# Patient Record
Sex: Female | Born: 1985 | Race: White | Hispanic: No | Marital: Single | Smoking: Never smoker
Health system: Southern US, Community
[De-identification: ages and names within clinical notes are randomized; demographics above are authoritative.]

## PROBLEM LIST (undated history)

## (undated) DIAGNOSIS — R519 Headache, unspecified: Secondary | ICD-10-CM

## (undated) DIAGNOSIS — E0590 Thyrotoxicosis, unspecified without thyrotoxic crisis or storm: Secondary | ICD-10-CM

## (undated) DIAGNOSIS — R0600 Dyspnea, unspecified: Secondary | ICD-10-CM

## (undated) DIAGNOSIS — O24419 Gestational diabetes mellitus in pregnancy, unspecified control: Secondary | ICD-10-CM

## (undated) DIAGNOSIS — K8050 Calculus of bile duct without cholangitis or cholecystitis without obstruction: Secondary | ICD-10-CM

## (undated) DIAGNOSIS — Z87442 Personal history of urinary calculi: Secondary | ICD-10-CM

## (undated) DIAGNOSIS — Z8489 Family history of other specified conditions: Secondary | ICD-10-CM

## (undated) DIAGNOSIS — F419 Anxiety disorder, unspecified: Secondary | ICD-10-CM

## (undated) DIAGNOSIS — F32A Depression, unspecified: Secondary | ICD-10-CM

## (undated) DIAGNOSIS — D649 Anemia, unspecified: Secondary | ICD-10-CM

## (undated) DIAGNOSIS — J45909 Unspecified asthma, uncomplicated: Secondary | ICD-10-CM

## (undated) DIAGNOSIS — J189 Pneumonia, unspecified organism: Secondary | ICD-10-CM

## (undated) HISTORY — DX: Unspecified asthma, uncomplicated: J45.909

## (undated) HISTORY — DX: Depression, unspecified: F32.A

## (undated) HISTORY — DX: Gestational diabetes mellitus in pregnancy, unspecified control: O24.419

## (undated) HISTORY — PX: TONSILLECTOMY: SUR1361

## (undated) HISTORY — DX: Anxiety disorder, unspecified: F41.9

---

## 2013-12-09 DIAGNOSIS — N3946 Mixed incontinence: Secondary | ICD-10-CM | POA: Insufficient documentation

## 2021-03-04 NOTE — L&D Delivery Note (Cosign Needed Addendum)
LABOR COURSE  Rachel Mendez is a 36 y.o. female G2P1001 with IUP at [redacted]w[redacted]d presenting for IOL for polyhydramnios, hyperthryoidism on PTU, A1GDM. TOLAC. Prolonged labor course utilizing foley balloon, AROM and pitocin. Internals placed due to maternal body habitus.   Delivery Note Called to room and patient was complete and pushing. Head delivered OA. Nuchal cord present x1. Unable to reduce nuchal while delivering so baby somersaulted after delivery and onto maternal abdomen. Shoulder and body delivered in usual fashion. At  1836 a viable and healthy female was delivered via Vaginal, Spontaneous (Presentation:OA;LOA ).  Infant with spontaneous cry, placed on mother's abdomen, dried and stimulated. Cord clamped x 2 after 1-minute delay, and cut by FOB. Cord blood drawn. Placenta delivered spontaneously with gentle cord traction. Appears intact. Fundus firm with massage and Pitocin. Labia, perineum, vagina, and cervix inspected with 2nd degree perineal laceration.    APGAR: 8,9 ; weight not yet recorded.  Cord: 3VC with the following complications:N/A.    Anesthesia: Epidural Episiotomy: None Lacerations: 2nd degree perineal laceration Suture Repair: 3.0 vicryl Est. Blood Loss (mL): 87  Mom to postpartum.  Baby to Couplet care / Skin to Skin.  Cashion, Cyndra Numbers, MD Resident Physician 7:19 PM     Fellow ATTESTATION  I was present and gloved for this delivery and agree with the above documentation in the resident's note. Successful VBAC. TXA after delivery of placenta, given risk of PPH due to length of time on pitocin. Baby seen by pediatrics team due to needing O2 in transitional period.  Alfredia Ferguson, MD/[facility] [employer] (Faculty Practice) 11/13/2021, 7:52 PM

## 2021-04-11 ENCOUNTER — Ambulatory Visit (INDEPENDENT_AMBULATORY_CARE_PROVIDER_SITE_OTHER): Payer: BC Managed Care – PPO

## 2021-04-11 ENCOUNTER — Other Ambulatory Visit: Payer: Self-pay

## 2021-04-11 ENCOUNTER — Ambulatory Visit (INDEPENDENT_AMBULATORY_CARE_PROVIDER_SITE_OTHER): Payer: BC Managed Care – PPO | Admitting: *Deleted

## 2021-04-11 VITALS — BP 140/81 | HR 81 | Ht 65.0 in | Wt 278.0 lb

## 2021-04-11 DIAGNOSIS — Z98891 History of uterine scar from previous surgery: Secondary | ICD-10-CM

## 2021-04-11 DIAGNOSIS — O3680X0 Pregnancy with inconclusive fetal viability, not applicable or unspecified: Secondary | ICD-10-CM

## 2021-04-11 DIAGNOSIS — Z3481 Encounter for supervision of other normal pregnancy, first trimester: Secondary | ICD-10-CM

## 2021-04-11 DIAGNOSIS — Z3A01 Less than 8 weeks gestation of pregnancy: Secondary | ICD-10-CM | POA: Diagnosis not present

## 2021-04-11 DIAGNOSIS — O0990 Supervision of high risk pregnancy, unspecified, unspecified trimester: Secondary | ICD-10-CM

## 2021-04-11 NOTE — Progress Notes (Addendum)
Last depo was in October  Last pap was with [facility] 2021  New OB Intake  I explained I am completing New OB Intake today. We discussed her EDD of 11/30/2021 that is based off today's scan. Pt was on depo and her last injection was in October.I reviewed her allergies, medications, Medical/Surgical/OB history, and appropriate screenings. I informed her of [facility] services.  There are no problems to display for this patient.   Concerns addressed today  Delivery Plans:  Plans to deliver at [facility].    Anatomy US Explained first scheduled [place] will be around 19 weeks.   Labs Discussed Avelina Laine genetic screening with patient. Would like both Panorama and Horizon drawn at new OB visit. Routine prenatal labs needed.   Placed OB Box on problem list and updated   Patient informed that the ultrasound is considered a limited obstetric ultrasound and is not intended to be a complete ultrasound exam.  Patient also informed that the ultrasound is not being completed with the intent of assessing for fetal or placental anomalies or any pelvic abnormalities. Explained that the purpose of today's ultrasound is to assess for dating and fetal heart rate.  Patient acknowledges the purpose of the exam and the limitations of the study.      First visit review I reviewed new OB appt with pt. I explained she will have  ob bloodwork with genetic screening.  Explained pt will be seen by Dr Shawnie Pons  at first visit; encounter routed to appropriate provider.  Scheryl Marten, RN 04/11/2021  11:59 AM

## 2021-04-16 ENCOUNTER — Emergency Department
Admission: EM | Admit: 2021-04-16 | Discharge: 2021-04-16 | Disposition: A | Discharge status: Home / Self Care | Payer: BC Managed Care – PPO | Attending: Emergency Medicine | Admitting: Emergency Medicine

## 2021-04-16 ENCOUNTER — Emergency Department: Payer: BC Managed Care – PPO

## 2021-04-16 ENCOUNTER — Encounter: Payer: Self-pay | Admitting: Emergency Medicine

## 2021-04-16 ENCOUNTER — Other Ambulatory Visit: Payer: Self-pay

## 2021-04-16 DIAGNOSIS — O4690 Antepartum hemorrhage, unspecified, unspecified trimester: Secondary | ICD-10-CM

## 2021-04-16 DIAGNOSIS — O26891 Other specified pregnancy related conditions, first trimester: Secondary | ICD-10-CM | POA: Insufficient documentation

## 2021-04-16 DIAGNOSIS — Z3A01 Less than 8 weeks gestation of pregnancy: Secondary | ICD-10-CM | POA: Insufficient documentation

## 2021-04-16 DIAGNOSIS — R1030 Lower abdominal pain, unspecified: Secondary | ICD-10-CM | POA: Diagnosis not present

## 2021-04-16 DIAGNOSIS — R1010 Upper abdominal pain, unspecified: Secondary | ICD-10-CM | POA: Diagnosis not present

## 2021-04-16 DIAGNOSIS — R1084 Generalized abdominal pain: Secondary | ICD-10-CM

## 2021-04-16 LAB — URINALYSIS, ROUTINE W REFLEX MICROSCOPIC
Bilirubin Urine: NEGATIVE
Glucose, UA: NEGATIVE mg/dL
Hgb urine dipstick: NEGATIVE
Ketones, ur: NEGATIVE mg/dL
Leukocytes,Ua: NEGATIVE
Nitrite: NEGATIVE
Protein, ur: NEGATIVE mg/dL
Specific Gravity, Urine: 1.008 (ref 1.005–1.030)
pH: 6 (ref 5.0–8.0)

## 2021-04-16 LAB — COMPREHENSIVE METABOLIC PANEL
ALT: 17 U/L (ref 0–44)
AST: 16 U/L (ref 15–41)
Albumin: 3.6 g/dL (ref 3.5–5.0)
Alkaline Phosphatase: 86 U/L (ref 38–126)
Anion gap: 5 (ref 5–15)
BUN: 13 mg/dL (ref 6–20)
CO2: 26 mmol/L (ref 22–32)
Calcium: 9.1 mg/dL (ref 8.9–10.3)
Chloride: 99 mmol/L (ref 98–111)
Creatinine, Ser: 0.55 mg/dL (ref 0.44–1.00)
GFR, Estimated: >60 mL/min (ref >60)
Glucose, Bld: 86 mg/dL (ref 70–99)
Potassium: 3.7 mmol/L (ref 3.5–5.1)
Sodium: 130 mmol/L — ABNORMAL LOW (ref 135–145)
Total Bilirubin: 0.3 mg/dL (ref 0.3–1.2)
Total Protein: 7 g/dL (ref 6.5–8.1)

## 2021-04-16 LAB — LIPASE, BLOOD: Lipase: 32 U/L (ref 11–51)

## 2021-04-16 LAB — CBC
HCT: 42 % (ref 36.0–46.0)
Hemoglobin: 13.5 g/dL (ref 12.0–15.0)
MCH: 26.5 pg (ref 26.0–34.0)
MCHC: 32.1 g/dL (ref 30.0–36.0)
MCV: 82.4 fL (ref 80.0–100.0)
Platelets: 309 10*3/uL (ref 150–400)
RBC: 5.1 MIL/uL (ref 3.87–5.11)
RDW: 13.9 % (ref 11.5–15.5)
WBC: 10.5 10*3/uL (ref 4.0–10.5)
nRBC: 0 % (ref 0.0–0.2)

## 2021-04-16 LAB — POC URINE PREG, ED: Preg Test, Ur: POSITIVE — AB

## 2021-04-16 NOTE — Discharge Instructions (Addendum)
Your ultrasound was reassuring.  There were no abnormal findings.  You can take Tylenol or Pepcid for your pain.  Please follow-up with your OB/GYN as scheduled.  If your pain is worsening or you develop heavy vaginal bleeding, please return to the emergency department.

## 2021-04-16 NOTE — ED Provider Notes (Signed)
[facility] Provider Note    Event Date/Time   First MD Initiated Contact with Patient 04/16/21 2110     (approximate)   History   Abdominal Pain   HPI  Rachel Mendez is a 36 y.o. female with past medical history of anxiety and depression who is approximately [redacted] weeks pregnant who presents with abdominal pain.  Patient notes that she has had mild cramping in the upper and lower abdomen for the past day.  No associated nausea vomiting diarrhea or constipation.  Denies pain with urination.  This morning when she wiped there was some light pink but no frank bleeding.  She is not had fevers.  Has had a first trimester ultrasound 5 days ago that showed an IUP.  This is her second pregnancy.    Past Medical History:  Diagnosis Date   Anxiety    Depression     Patient Active Problem List   Diagnosis Date Noted   Supervision of high risk pregnancy, antepartum 04/11/2021   Previous cesarean section 04/11/2021     Physical Exam  Triage Vital Signs: ED Triage Vitals  Enc Vitals Group     BP 04/16/21 2006 (!) 141/70     Pulse Rate 04/16/21 2006 95     Resp 04/16/21 2006 20     Temp 04/16/21 2006 98 F (36.7 C)     Temp Source 04/16/21 2006 Oral     SpO2 04/16/21 2006 99 %     Weight 04/16/21 2007 276 lb (125.2 kg)     Height 04/16/21 2007 5\' 5"  (1.651 m)     Head Circumference --      Peak Flow --      Pain Score 04/16/21 2007 4     Pain Loc --      Pain Edu? --      Excl. in GC? --     Most recent vital signs: Vitals:   04/16/21 2006  BP: (!) 141/70  Pulse: 95  Resp: 20  Temp: 98 F (36.7 C)  SpO2: 99%     General: Awake, no distress.  CV:  Good peripheral perfusion.  Resp:  Normal effort.  Abd:  No distention.  Minimal tenderness in the suprapubic region,, abdomen is soft Neuro:             Awake, Alert, Oriented x 3  Other:     ED Results / Procedures / Treatments  Labs (all labs ordered are listed, but only abnormal results  are displayed) Labs Reviewed  COMPREHENSIVE METABOLIC PANEL - Abnormal; Notable for the following components:      Result Value   Sodium 130 (*)    All other components within normal limits  URINALYSIS, ROUTINE W REFLEX MICROSCOPIC - Abnormal; Notable for the following components:   Color, Urine STRAW (*)    APPearance CLEAR (*)    All other components within normal limits  POC URINE PREG, ED - Abnormal; Notable for the following components:   Preg Test, Ur POSITIVE (*)    All other components within normal limits  LIPASE, BLOOD  CBC     EKG     RADIOLOGY Bedside ultrasound performed by myself shows a gestational sac with yolk sac, difficult to see if there is a fetal heart rate   PROCEDURES:  Critical Care performed: No    MEDICATIONS ORDERED IN ED: Medications - No data to display   IMPRESSION / MDM / ASSESSMENT AND PLAN / ED COURSE  I reviewed the triage vital signs and the nursing notes.                              Differential diagnosis includes, but is not limited to, miscarriage, gastritis, reflux, biliary colic, pancreatitis  Patient is a 36 year old female is approximately [redacted] weeks pregnant presents with some generalized abdominal cramping.  Pain is located epigastric in the bilateral lower quadrants.  Patient denies being significantly uncomfortable but did have some scant vaginal bleeding this morning and was told by her OB to come to the ED.  She has no associated nausea vomiting fevers chills or diarrhea.  Patient's vital signs are notable for mild hypertension otherwise within normal limits.  She appears very well on exam appears comfortable.  She has mild suprapubic tenderness but otherwise her exam is benign.  I reviewed her labs are reassuring, she has no leukocytosis, CMP without abnormality and UA not suggestive of infection.  Lipase normal.  I performed a bedside ultrasound which shows a gestational sac and yolk sac but difficult to see fetal heart rate  on the fetal pole likely due to patient's habitus and being early in her pregnancy.  Transvaginal ultrasound ordered which shows IUP with fetal heart rate 154.  Patient without any significant bleeding, no indication for RhoGAM so ABO Rh not checked.  Patient is stable for discharge.      FINAL CLINICAL IMPRESSION(S) / ED DIAGNOSES   Final diagnoses:  Generalized abdominal pain     Rx / DC Orders   ED Discharge Orders     None        Note:  This document was prepared using Dragon voice recognition software and may include unintentional dictation errors.   Georga Hacking, MD 04/16/21 [phone]

## 2021-04-16 NOTE — ED Triage Notes (Signed)
Pt to ED POV for lower abdominal pain, and dizziness that started this morning. Pt is [redacted] weeks pregnant. Denies N/V/D. Denies LOC.  Pt states shes been having light spotting this AM. Denies any urinary symptoms OBGYN- [facility]

## 2021-05-08 ENCOUNTER — Encounter: Payer: BC Managed Care – PPO | Admitting: Obstetrics & Gynecology

## 2021-05-16 ENCOUNTER — Encounter: Payer: BC Managed Care – PPO | Admitting: Family Medicine

## 2021-05-16 ENCOUNTER — Ambulatory Visit (INDEPENDENT_AMBULATORY_CARE_PROVIDER_SITE_OTHER): Payer: BC Managed Care – PPO | Admitting: Family Medicine

## 2021-05-16 ENCOUNTER — Other Ambulatory Visit (HOSPITAL_COMMUNITY)
Admission: RE | Admit: 2021-05-16 | Discharge: 2021-05-16 | Disposition: A | Discharge status: Home / Self Care | Payer: BC Managed Care – PPO | Source: Ambulatory Visit | Attending: Family Medicine | Admitting: Family Medicine

## 2021-05-16 ENCOUNTER — Telehealth: Payer: Self-pay | Admitting: Family Medicine

## 2021-05-16 ENCOUNTER — Encounter: Payer: Self-pay | Admitting: Family Medicine

## 2021-05-16 ENCOUNTER — Other Ambulatory Visit: Payer: Self-pay

## 2021-05-16 VITALS — BP 137/81 | HR 106 | Wt 275.0 lb

## 2021-05-16 DIAGNOSIS — E669 Obesity, unspecified: Secondary | ICD-10-CM | POA: Insufficient documentation

## 2021-05-16 DIAGNOSIS — Z124 Encounter for screening for malignant neoplasm of cervix: Secondary | ICD-10-CM | POA: Diagnosis present

## 2021-05-16 DIAGNOSIS — Z6841 Body Mass Index (BMI) 40.0 and over, adult: Secondary | ICD-10-CM

## 2021-05-16 DIAGNOSIS — F988 Other specified behavioral and emotional disorders with onset usually occurring in childhood and adolescence: Secondary | ICD-10-CM | POA: Insufficient documentation

## 2021-05-16 DIAGNOSIS — O0990 Supervision of high risk pregnancy, unspecified, unspecified trimester: Secondary | ICD-10-CM

## 2021-05-16 DIAGNOSIS — J4520 Mild intermittent asthma, uncomplicated: Secondary | ICD-10-CM

## 2021-05-16 DIAGNOSIS — J45909 Unspecified asthma, uncomplicated: Secondary | ICD-10-CM | POA: Insufficient documentation

## 2021-05-16 DIAGNOSIS — Z98891 History of uterine scar from previous surgery: Secondary | ICD-10-CM

## 2021-05-16 DIAGNOSIS — F32A Depression, unspecified: Secondary | ICD-10-CM | POA: Insufficient documentation

## 2021-05-16 DIAGNOSIS — O219 Vomiting of pregnancy, unspecified: Secondary | ICD-10-CM

## 2021-05-16 DIAGNOSIS — F419 Anxiety disorder, unspecified: Secondary | ICD-10-CM | POA: Insufficient documentation

## 2021-05-16 MED ORDER — PROMETHAZINE HCL 12.5 MG RE SUPP: 12.5000 mg | Freq: Four times a day (QID) | RECTAL | 0 refills | Status: DC | PRN

## 2021-05-16 NOTE — Progress Notes (Signed)
Pt states pap smear was about a year ago.  ?PHQ9: 15 ?GAD7: 13 ? ?Pt states she was diagnosed with severe anxiety and depression at 36 year old.  ?

## 2021-05-16 NOTE — Telephone Encounter (Signed)
Called patient to notify her of her MFM Jacques Earthly) appointment on 07/06/21 at 8:15.  ?

## 2021-05-16 NOTE — Progress Notes (Signed)
? ?  ? ?Subjective:  ? ?Rachel Mendez is a 36 y.o. G2P1001 at [redacted]w[redacted]d by early ultrasound being seen today for her first obstetrical visit.  Her obstetrical history is significant for obesity and previous cesarean . Patient does intend to breast feed. Pregnancy history fully reviewed. ? ?Patient reports nausea and vomiting since having COVID last week. ? ?HISTORY: ?OB History  ?Gravida Para Term Preterm AB Living  ?2 1 1  0 0 1  ?SAB IAB Ectopic Multiple Live Births  ?0 0 0 0 1  ?  ?# Outcome Date GA Lbr Len/2nd Weight Sex Delivery Anes PTL Lv  ?2 Current           ?1 Term 01/23/18 [redacted]w[redacted]d  7 lb 14 oz (3.572 kg) M CS-LTranv  N LIV  ? Last pap smear was  2022 and was normal ?Past Medical History:  ?Diagnosis Date  ? Anxiety   ? Asthma   ? Depression   ? ?Past Surgical History:  ?Procedure Laterality Date  ? CESAREAN SECTION    ? ?Family History  ?Problem Relation Age of Onset  ? Anxiety disorder Mother   ? Depression Mother   ? Hashimoto's thyroiditis Mother   ? ADD / ADHD Sister   ? Epilepsy Sister   ? Epilepsy Maternal Grandmother   ? Diabetes Paternal Grandfather   ?     Type 1  ? ?Social History  ? ?Tobacco Use  ? Smoking status: Never  ? Smokeless tobacco: Never  ?Vaping Use  ? Vaping Use: Former  ? Quit date: 04/04/2021  ?Substance Use Topics  ? Alcohol use: Not Currently  ? Drug use: Not Currently  ? ?Allergies  ?Allergen Reactions  ? Bee Venom Anaphylaxis  ? Latex Hives  ? ?Current Outpatient Medications on File Prior to Visit  ?Medication Sig Dispense Refill  ? Prenatal Vit-Fe Fumarate-FA (PRENATAL MULTIVITAMIN) TABS tablet Take 1 tablet by mouth daily at 12 noon.    ? sertraline (ZOLOFT) 50 MG tablet Take 50 mg by mouth daily.    ? albuterol (ACCUNEB) 0.63 MG/3ML nebulizer solution SMARTSIG:Via Nebulizer 2-3 Times Daily PRN    ? ondansetron (ZOFRAN-ODT) 4 MG disintegrating tablet Take 4 mg by mouth every 6 (six) hours as needed.    ? VENTOLIN HFA 108 (90 Base) MCG/ACT inhaler SMARTSIG:1 Puff(s) Via Inhaler Every  6 Hours PRN    ? ?No current facility-administered medications on file prior to visit.  ? ? ? ?Exam  ? ?Vitals:  ? 05/16/21 0852  ?BP: 137/81  ?Pulse: (!) 106  ?Weight: 275 lb (124.7 kg)  ? ? FHR 155 ? ?Uterus:    12 week size, though exam limited by body habitus  ?Pelvic Exam: Perineum: no hemorrhoids, normal perineum  ? Vulva: normal external genitalia, no lesions  ? Vagina:  normal mucosa, normal discharge  ? Cervix: no lesions and normal, pap smear done.   ? Adnexa: normal adnexa and no mass, fullness, tenderness  ? Bony Pelvis: average  ?System: General: Moderately obese female in no acute distress  ? Breast:  normal appearance, no masses or tenderness  ? Skin: normal coloration and turgor, no rashes  ? Neurologic: oriented, normal, negative, normal mood  ? Extremities: normal strength, tone, and muscle mass, ROM of all joints is normal  ? HEENT PERRLA, extraocular movement intact and sclera clear, anicteric  ? Mouth/Teeth mucous membranes moist, pharynx normal without lesions and dental hygiene good  ? Neck supple and no masses  ? Cardiovascular: regular  rate and rhythm  ? Respiratory:  no respiratory distress, normal breath sounds  ? Abdomen: soft, non-tender; bowel sounds normal; no masses,  no organomegaly  ? ?  ?Assessment:  ? ?Pregnancy: G2P1001 ?Patient Active Problem List  ? Diagnosis Date Noted  ? Obesity 05/16/2021  ? Anxiety 05/16/2021  ? Depression 05/16/2021  ? ADD (attention deficit disorder) 05/16/2021  ? Asthma 05/16/2021  ? Supervision of high risk pregnancy, antepartum 04/11/2021  ? Previous cesarean section 04/11/2021  ? Mixed incontinence 12/09/2013  ? ?  ?Plan:  ?1. Supervision of high risk pregnancy, antepartum ?New OB labs ?- Genetic Screening ?- CBC/D/Plt+RPR+Rh+ABO+RubIgG... ?- Culture, OB Urine ?- [place] MFM OB DETAIL +14 WK; Future ? ?2. Anxiety ?On zoloft, has decreased dose from 100-->50. Feels ok at this dose ? ?3. Previous cesarean section ?Strongly desires TOLAC ? ?4. Mild  intermittent asthma without complication ?Has inhaler and nebulaizer if needed ? ?5. Class 3 severe obesity due to excess calories without serious comorbidity with body mass index (BMI) of 45.0 to 49.9 in adult [facility]) ?Check baseline labs ?No h/o GDM or GHTN in last pregnancy ?- Hemoglobin A1c ? ?6. Screening for cervical cancer ?- Cytology - PAP ? ?7. Nausea and vomiting in pregnancy prior to [redacted] weeks gestation ?Zofran is not helping, Suppository given ?- promethazine (PHENERGAN) 12.5 MG suppository; Place 1 suppository (12.5 mg total) rectally every 6 (six) hours as needed for nausea or vomiting.  Dispense: 12 each; Refill: 0 ? ? ?Initial labs drawn. ?Continue prenatal vitamins. ?Genetic Screening discussed, NIPS: ordered. ?Ultrasound discussed; fetal anatomic survey: ordered. ?Problem list reviewed and updated. ?The nature of [facility] with multiple MDs and other Advanced Practice Providers was explained to patient; also emphasized that residents, students are part of our team. ?Routine obstetric precautions reviewed. ?Return in about 4 weeks (around 06/13/2021) for ob visit. ? ?  ? ?

## 2021-05-17 LAB — CYTOLOGY - PAP
Chlamydia: NEGATIVE
Comment: NEGATIVE
Comment: NEGATIVE
Comment: NORMAL
Diagnosis: NEGATIVE
High risk HPV: NEGATIVE
Neisseria Gonorrhea: NEGATIVE

## 2021-05-18 LAB — CBC/D/PLT+RPR+RH+ABO+RUBIGG...
Basophils Absolute: 0 10*3/uL (ref 0.0–0.2)
Basos: 0 %
EOS (ABSOLUTE): 0.1 10*3/uL (ref 0.0–0.4)
Eos: 2 %
HCV Ab: NONREACTIVE
HIV Screen 4th Generation wRfx: NONREACTIVE
Hematocrit: 40.3 % (ref 34.0–46.6)
Hemoglobin: 13 g/dL (ref 11.1–15.9)
Hepatitis B Surface Ag: NEGATIVE
Immature Grans (Abs): 0 10*3/uL (ref 0.0–0.1)
Immature Granulocytes: 0 %
Lymphocytes Absolute: 1.7 10*3/uL (ref 0.7–3.1)
Lymphs: 25 %
MCH: 25.6 pg — ABNORMAL LOW (ref 26.6–33.0)
MCHC: 32.3 g/dL (ref 31.5–35.7)
MCV: 79 fL (ref 79–97)
Monocytes Absolute: 0.6 10*3/uL (ref 0.1–0.9)
Monocytes: 8 %
Neutrophils Absolute: 4.5 10*3/uL (ref 1.4–7.0)
Neutrophils: 65 %
Platelets: 233 10*3/uL (ref 150–450)
RBC: 5.08 x10E6/uL (ref 3.77–5.28)
RDW: 13.8 % (ref 11.7–15.4)
RPR Ser Ql: NONREACTIVE
Rh Factor: POSITIVE
Rubella Antibodies, IGG: 1.25 index (ref >0.99)
WBC: 7 10*3/uL (ref 3.4–10.8)

## 2021-05-18 LAB — HCV INTERPRETATION

## 2021-05-18 LAB — AB SCR+ANTIBODY ID

## 2021-05-18 LAB — HEMOGLOBIN A1C
Est. average glucose Bld gHb Est-mCnc: 103 mg/dL
Hgb A1c MFr Bld: 5.2 % (ref 4.8–5.6)

## 2021-05-18 LAB — CULTURE, OB URINE

## 2021-05-18 LAB — URINE CULTURE, OB REFLEX

## 2021-05-24 ENCOUNTER — Other Ambulatory Visit: Payer: Self-pay | Admitting: *Deleted

## 2021-05-24 DIAGNOSIS — O219 Vomiting of pregnancy, unspecified: Secondary | ICD-10-CM

## 2021-05-24 MED ORDER — PROMETHAZINE HCL 25 MG PO TABS: 25.0000 mg | ORAL_TABLET | Freq: Four times a day (QID) | ORAL | 2 refills | Status: DC | PRN

## 2021-05-30 ENCOUNTER — Telehealth: Payer: Self-pay | Admitting: Family Medicine

## 2021-05-30 DIAGNOSIS — O285 Abnormal chromosomal and genetic finding on antenatal screening of mother: Secondary | ICD-10-CM

## 2021-05-30 NOTE — Telephone Encounter (Signed)
Genetic testing shows high risk due to low fetal fraction and they recommend genetic counseling and detailed anatomy u/s. ?Will send in orders. ?

## 2021-06-11 ENCOUNTER — Encounter: Payer: Self-pay | Admitting: *Deleted

## 2021-06-12 ENCOUNTER — Encounter (HOSPITAL_COMMUNITY): Payer: Self-pay | Admitting: Obstetrics & Gynecology

## 2021-06-12 ENCOUNTER — Other Ambulatory Visit: Payer: Self-pay | Admitting: *Deleted

## 2021-06-12 ENCOUNTER — Inpatient Hospital Stay (HOSPITAL_COMMUNITY)
Admission: AD | Admit: 2021-06-12 | Discharge: 2021-06-12 | Disposition: A | Discharge status: Home / Self Care | Payer: Medicaid Other | Attending: Obstetrics & Gynecology | Admitting: Obstetrics & Gynecology

## 2021-06-12 ENCOUNTER — Other Ambulatory Visit: Payer: Self-pay

## 2021-06-12 DIAGNOSIS — O285 Abnormal chromosomal and genetic finding on antenatal screening of mother: Secondary | ICD-10-CM

## 2021-06-12 DIAGNOSIS — O219 Vomiting of pregnancy, unspecified: Secondary | ICD-10-CM | POA: Diagnosis not present

## 2021-06-12 DIAGNOSIS — Z3A15 15 weeks gestation of pregnancy: Secondary | ICD-10-CM | POA: Diagnosis not present

## 2021-06-12 DIAGNOSIS — O210 Mild hyperemesis gravidarum: Secondary | ICD-10-CM | POA: Diagnosis present

## 2021-06-12 DIAGNOSIS — O368390 Maternal care for abnormalities of the fetal heart rate or rhythm, unspecified trimester, not applicable or unspecified: Secondary | ICD-10-CM | POA: Diagnosis not present

## 2021-06-12 LAB — URINALYSIS, ROUTINE W REFLEX MICROSCOPIC
Bilirubin Urine: NEGATIVE
Glucose, UA: NEGATIVE mg/dL
Hgb urine dipstick: NEGATIVE
Ketones, ur: NEGATIVE mg/dL
Leukocytes,Ua: NEGATIVE
Nitrite: NEGATIVE
Protein, ur: NEGATIVE mg/dL
Specific Gravity, Urine: 1.005 (ref 1.005–1.030)
pH: 8 (ref 5.0–8.0)

## 2021-06-12 MED ORDER — METOCLOPRAMIDE HCL 10 MG PO TABS: 10.0000 mg | ORAL_TABLET | Freq: Three times a day (TID) | ORAL | 0 refills | Status: DC | PRN

## 2021-06-12 MED ORDER — ONDANSETRON HCL 4 MG/2ML IJ SOLN
4.0000 mg | Freq: Once | INTRAMUSCULAR | Status: AC
  Administered 2021-06-12: 4 mg via INTRAVENOUS
  Filled 2021-06-12: qty 2

## 2021-06-12 MED ORDER — PANTOPRAZOLE SODIUM 40 MG PO TBEC
40.0000 mg | DELAYED_RELEASE_TABLET | Freq: Every day | ORAL | 0 refills | Status: DC
Start: 2021-06-12 — End: 2021-08-21

## 2021-06-12 MED ORDER — FAMOTIDINE IN NACL 20-0.9 MG/50ML-% IV SOLN
20.0000 mg | Freq: Once | INTRAVENOUS | Status: AC
  Administered 2021-06-12: 20 mg via INTRAVENOUS
  Filled 2021-06-12: qty 50

## 2021-06-12 MED ORDER — LACTATED RINGERS IV BOLUS
1000.0000 mL | Freq: Once | INTRAVENOUS | Status: AC
  Administered 2021-06-12: 1000 mL via INTRAVENOUS

## 2021-06-12 NOTE — MAU Provider Note (Signed)
?History  ?  ? ?[id number] ? ?Arrival date and time: 06/12/21 1242 ?  ? ?Chief Complaint  ?Patient presents with  ? Emesis  ? Nausea  ? Abdominal Pain  ? ? ? ?HPI ?Rachel Mendez is a 36 y.o. at [redacted]w[redacted]d by ultrasound who presents for nausea & vomiting. This has been ongoing with the pregnancy. States she normally vomits 2-3 times per day. Has vomited 3 times since 2 am this morning. Tried to eat a sandwich earlier today but didn't keep it down. Hasn't taken antiemetic today. Went to the office & was told to come to MAU for IV fluids.  ?Denies fever, diarrhea, abdominal pain, dysuria, or vaginal bleeding.   ? ?OB History   ? ? Gravida  ?2  ? Para  ?1  ? Term  ?1  ? Preterm  ?   ? AB  ?   ? Living  ?1  ?  ? ? SAB  ?   ? IAB  ?   ? Ectopic  ?   ? Multiple  ?   ? Live Births  ?1  ?   ?  ?  ? ? ?Past Medical History:  ?Diagnosis Date  ? Anxiety   ? Asthma   ? Depression   ? ? ?Past Surgical History:  ?Procedure Laterality Date  ? CESAREAN SECTION    ? ? ?Family History  ?Problem Relation Age of Onset  ? Anxiety disorder Mother   ? Depression Mother   ? Hashimoto's thyroiditis Mother   ? ADD / ADHD Sister   ? Epilepsy Sister   ? Epilepsy Maternal Grandmother   ? Diabetes Paternal Grandfather   ?     Type 1  ? ? ?Allergies  ?Allergen Reactions  ? Bee Venom Anaphylaxis  ? Latex Hives  ? ? ?No current facility-administered medications on file prior to encounter.  ? ?Current Outpatient Medications on File Prior to Encounter  ?Medication Sig Dispense Refill  ? albuterol (ACCUNEB) 0.63 MG/3ML nebulizer solution SMARTSIG:Via Nebulizer 2-3 Times Daily PRN    ? Prenatal Vit-Fe Fumarate-FA (PRENATAL MULTIVITAMIN) TABS tablet Take 1 tablet by mouth daily at 12 noon.    ? promethazine (PHENERGAN) 25 MG tablet Take 1 tablet (25 mg total) by mouth every 6 (six) hours as needed for nausea or vomiting. 30 tablet 2  ? sertraline (ZOLOFT) 50 MG tablet Take 50 mg by mouth daily.    ? VENTOLIN HFA 108 (90 Base) MCG/ACT inhaler SMARTSIG:1 Puff(s)  Via Inhaler Every 6 Hours PRN    ? ? ? ?ROS ?Pertinent positives and negative per HPI, all others reviewed and negative ? ?Physical Exam  ? ?BP (!) 117/52   Pulse 92   Temp 98.8 ?F (37.1 ?C) (Oral)   Resp 18   Ht 5' 5.5" (1.664 m)   Wt 124.3 kg   LMP 01/25/2021   SpO2 100%   BMI 44.90 kg/m?  ? ?Patient Vitals for the past 24 hrs: ? BP Temp Temp src Pulse Resp SpO2 Height Weight  ?06/12/21 1536 (!) 117/52 -- -- 92 18 100 % -- --  ?06/12/21 1304 (!) 126/53 98.8 ?F (37.1 ?C) Oral 83 18 99 % 5' 5.5" (1.664 m) 124.3 kg  ? ? ?Physical Exam ?Vitals and nursing note reviewed.  ?Constitutional:   ?   General: She is not in acute distress. ?   Appearance: She is well-developed.  ?Pulmonary:  ?   Effort: Pulmonary effort is normal. No respiratory distress.  ?Abdominal:  ?  Palpations: Abdomen is soft.  ?   Tenderness: There is no abdominal tenderness.  ?Skin: ?   General: Skin is warm and dry.  ?Neurological:  ?   Mental Status: She is alert.  ?  ?Bedside Ultrasound ?Pt informed that the ultrasound is considered a limited OB ultrasound and is not intended to be a complete ultrasound exam.  Patient also informed that the ultrasound is not being completed with the intent of assessing for fetal or placental anomalies or any pelvic abnormalities.  Explained that the purpose of today?s ultrasound is to assess for  viability.  Patient acknowledges the purpose of the exam and the limitations of the study.   ? ? ?My interpretation: Active fetus, fetal heart rate 158 bpm ? ? ?Labs ?Results for orders placed or performed during the hospital encounter of 06/12/21 (from the past 24 hour(s))  ?Urinalysis, Routine w reflex microscopic Urine, Clean Catch     Status: Abnormal  ? Collection Time: 06/12/21  1:35 PM  ?Result Value Ref Range  ? Color, Urine YELLOW YELLOW  ? APPearance HAZY (A) CLEAR  ? Specific Gravity, Urine 1.005 1.005 - 1.030  ? pH 8.0 5.0 - 8.0  ? Glucose, UA NEGATIVE NEGATIVE mg/dL  ? Hgb urine dipstick NEGATIVE  NEGATIVE  ? Bilirubin Urine NEGATIVE NEGATIVE  ? Ketones, ur NEGATIVE NEGATIVE mg/dL  ? Protein, ur NEGATIVE NEGATIVE mg/dL  ? Nitrite NEGATIVE NEGATIVE  ? Leukocytes,Ua NEGATIVE NEGATIVE  ? RBC / HPF 0-5 0 - 5 RBC/hpf  ? WBC, UA 0-5 0 - 5 WBC/hpf  ? Bacteria, UA FEW (A) NONE SEEN  ? Squamous Epithelial / LPF 6-10 0 - 5  ? ? ?Imaging ?No results found. ? ?MAU Course  ?Procedures ?Lab Orders    ?     Urinalysis, Routine w reflex microscopic Urine, Clean Catch    ?Meds ordered this encounter  ?Medications  ? lactated ringers bolus 1,000 mL  ? famotidine (PEPCID) IVPB 20 mg premix  ? ondansetron (ZOFRAN) injection 4 mg  ? pantoprazole (PROTONIX) 40 MG tablet  ?  Sig: Take 1 tablet (40 mg total) by mouth daily.  ?  Dispense:  30 tablet  ?  Refill:  0  ?  Order Specific Question:   Supervising Provider  ?  Answer:   Adam PhenixRNOLD, JAMES G [[id number]]  ? metoCLOPramide (REGLAN) 10 MG tablet  ?  Sig: Take 1 tablet (10 mg total) by mouth every 8 (eight) hours as needed for nausea.  ?  Dispense:  30 tablet  ?  Refill:  0  ?  Order Specific Question:   Supervising Provider  ?  Answer:   Adam PhenixRNOLD, JAMES G [[id number]]  ? ?Imaging Orders  ?No imaging studies ordered today  ? ? ?MDM ?Patient states she was sent here for IV fluids. Given bolus of LR, zofran, & pepcid. Patient requesting prescription for protonix & reglan for at home use.  ? ?Bedside ultrasound performed since RN unable to doppler fetal heart tones. See documentation under exam.  ? ?Patient has questions regarding her genetic screening results. Discussed that results show high risk for trisomy 7113 or 5118. Patient is scheduled with MFM next month for anatomy ultrasound & genetic counseling. Discussed with patient that further testing will be offered by MFM.  ?Gave patient contact info for [employer] if she wants to have genetic counseling appointment with them prior to next month.  ?Assessment and Plan  ? ?1. Nausea and vomiting during pregnancy prior to [redacted] weeks  gestation  ?-Rx protonix &  reglan  ?2. Ultrasound scan done for inability to hear fetal heart tones  ?-FHT present per bedside ultrasound  ?3. [redacted] weeks gestation of pregnancy   ? ? ? ?Judeth Horn, NP ?06/12/21 ?6:03 PM ? ? ?

## 2021-06-12 NOTE — Discharge Instructions (Signed)
If you would like to speak with a Garment/textile technologist regarding your high risk Panorama result, you can schedule a 15 minute information session at [email or web address] or by contacting by phone at [phone] ?

## 2021-06-12 NOTE — MAU Note (Signed)
.  Rachel Mendez is a 36 y.o. at [redacted]w[redacted]d here in MAU reporting: she is dehydrated and is having tingling, pricking pain bilateral lower abd since last pm. Also reports she has not been able to keep any food or liquid down for the last 2 days and is unable to keep her meds down. Also has rectal suppositories but states she cannot use these. Has had some spotting this am.  ?Onset of complaint: 2 days ago ?Pain score: 2/10 ?Vitals:  ? 06/12/21 1304  ?BP: (!) 126/53  ?Pulse: 83  ?Resp: 18  ?Temp: 98.8 ?F (37.1 ?C)  ?SpO2: 99%  ?   ?FHT: ? ? ? ? ? ? ? ? ? ? ? ? ? ? ? ? ? ? ?Lab orders placed from triage:  urine ? ?

## 2021-06-18 ENCOUNTER — Encounter: Payer: BC Managed Care – PPO | Admitting: Family Medicine

## 2021-06-19 ENCOUNTER — Encounter: Payer: BC Managed Care – PPO | Admitting: Obstetrics & Gynecology

## 2021-06-22 ENCOUNTER — Telehealth: Payer: Self-pay

## 2021-06-22 NOTE — Telephone Encounter (Signed)
TC from pt sister regarding Rachel Mendez results ?Would like to know if gender can be known before the weekend.  ?Also asked about abnormal results pt has upcoming MFM appt and I gave pt sister Rachel Mendez [facility] contact info to help with getting more info on recent results ?Message sent to [facility] rep to get form to add gender may take up to 72 hours.  ? ?  ?

## 2021-06-29 ENCOUNTER — Ambulatory Visit: Payer: Medicaid Other | Attending: Family Medicine

## 2021-06-29 ENCOUNTER — Ambulatory Visit: Payer: Medicaid Other | Admitting: *Deleted

## 2021-06-29 ENCOUNTER — Other Ambulatory Visit: Payer: Self-pay | Admitting: *Deleted

## 2021-06-29 VITALS — BP 128/61 | HR 89

## 2021-06-29 DIAGNOSIS — O0990 Supervision of high risk pregnancy, unspecified, unspecified trimester: Secondary | ICD-10-CM | POA: Insufficient documentation

## 2021-06-29 DIAGNOSIS — O285 Abnormal chromosomal and genetic finding on antenatal screening of mother: Secondary | ICD-10-CM

## 2021-06-29 DIAGNOSIS — Z98891 History of uterine scar from previous surgery: Secondary | ICD-10-CM

## 2021-06-29 DIAGNOSIS — O99212 Obesity complicating pregnancy, second trimester: Secondary | ICD-10-CM

## 2021-07-06 ENCOUNTER — Ambulatory Visit: Payer: Medicaid Other

## 2021-07-10 ENCOUNTER — Other Ambulatory Visit: Payer: Self-pay | Admitting: *Deleted

## 2021-07-10 MED ORDER — METOCLOPRAMIDE HCL 10 MG PO TABS: 10.0000 mg | ORAL_TABLET | Freq: Three times a day (TID) | ORAL | 1 refills | Status: DC | PRN

## 2021-07-16 ENCOUNTER — Ambulatory Visit (INDEPENDENT_AMBULATORY_CARE_PROVIDER_SITE_OTHER): Payer: Medicaid Other | Admitting: Obstetrics & Gynecology

## 2021-07-16 ENCOUNTER — Encounter: Payer: BC Managed Care – PPO | Admitting: Obstetrics & Gynecology

## 2021-07-16 ENCOUNTER — Telehealth: Payer: Self-pay

## 2021-07-16 VITALS — BP 128/82 | HR 89 | Wt 281.0 lb

## 2021-07-16 DIAGNOSIS — O99280 Endocrine, nutritional and metabolic diseases complicating pregnancy, unspecified trimester: Secondary | ICD-10-CM

## 2021-07-16 DIAGNOSIS — E0590 Thyrotoxicosis, unspecified without thyrotoxic crisis or storm: Secondary | ICD-10-CM

## 2021-07-16 DIAGNOSIS — O26812 Pregnancy related exhaustion and fatigue, second trimester: Secondary | ICD-10-CM

## 2021-07-16 DIAGNOSIS — E0500 Thyrotoxicosis with diffuse goiter without thyrotoxic crisis or storm: Secondary | ICD-10-CM

## 2021-07-16 DIAGNOSIS — O0990 Supervision of high risk pregnancy, unspecified, unspecified trimester: Secondary | ICD-10-CM

## 2021-07-16 DIAGNOSIS — Z3A20 20 weeks gestation of pregnancy: Secondary | ICD-10-CM

## 2021-07-16 DIAGNOSIS — Z98891 History of uterine scar from previous surgery: Secondary | ICD-10-CM

## 2021-07-16 DIAGNOSIS — J0110 Acute frontal sinusitis, unspecified: Secondary | ICD-10-CM

## 2021-07-16 MED ORDER — AZITHROMYCIN 250 MG PO TABS: ORAL_TABLET | ORAL | 1 refills | Status: DC

## 2021-07-16 NOTE — Telephone Encounter (Signed)
Pt called c/o of leaking onset yesterday ?Had to change underwear x 3 times  ?Pt states she was not sure if leaking was sweat or urine.  ?Pt states she is certain it is neither. Notes + FM yet states baby moves more at night time.  ?Pt advised to go to MAU for an evaluation no provider in the office in am at this time. Pt has 36 yr old at home and states she will have to wait for spouse to get home to go to MAU.  ?I let patient know I will consult w/RN to see if we can get pt in first of the afternoon for evaluation by provider.  ?Pt agreeable and voiced understanding.  ? ?

## 2021-07-16 NOTE — Progress Notes (Signed)
? ? ?PRENATAL VISIT NOTE ? ?Subjective:  ?Rachel Mendez is a 36 y.o. G2P1001 at [redacted]w[redacted]d being seen today for ongoing prenatal care.  She is currently monitored for the following issues for this high-risk pregnancy and has Supervision of high risk pregnancy, antepartum; Previous cesarean section; Obesity; Mixed incontinence; Anxiety; Depression; ADD (attention deficit disorder); and Asthma on their problem list. ? ?Patient reports fatigue over the few weeks, feels she is anemia. Hemoglobin during initial visit was 13. No CP, SOB.  Also reports watery fluid since yesterday, its enough that it soaked her panties last night and this morning, is now slowing down. Accompanied by pressure and low back pain. Also reports sinus infection, has tried OTC meds without relief. Productive of green mucus and very tender, has been treated with Z pack in the past. No fevers.  Contractions: Irritability. Vag. Bleeding: None.  Movement: Present. Denies leaking of fluid.  ? ?The following portions of the patient's history were reviewed and updated as appropriate: allergies, current medications, past family history, past medical history, past social history, past surgical history and problem list.  ? ?Objective:  ? ?Vitals:  ? 07/16/21 1445  ?BP: 128/82  ?Pulse: 89  ?Weight: 281 lb (127.5 kg)  ? ? ?Fetal Status: Fetal Heart Rate (bpm): + on u/s   Movement: Present    ?Positive FHR on bedside scan, adequate AFV noted (RN unable to auscultate on doppler). ? ?General:  Alert, oriented and cooperative. Patient is in no acute distress.  ?Skin: Skin is warm and dry. No rash noted.   ?Cardiovascular: Normal heart rate noted  ?Respiratory: Normal respiratory effort, no problems with respiration noted  ?Abdomen: Soft, gravid, appropriate for gestational age.  Pain/Pressure: Present     ?Pelvic: Cervical exam performed in the presence of a chaperone Dilation: Closed Effacement (%): Thick Station: Ballotable. No pooling, negative Amnioswab   ?Extremities: Normal range of motion.     ?Mental Status: Normal mood and affect. Normal behavior. Normal judgment and thought content.  ? ?[place] MFM OB DETAIL +14 WK ? ?Result Date: 06/29/2021 ?----------------------------------------------------------------------  OBSTETRICS REPORT                       (Signed Final 06/29/2021 02:37 pm) ---------------------------------------------------------------------- Patient Info  ID #:       [id number]                          D.O.B.:  1985-12-28 (36 yrs)  Name:       Rachel Mendez                   Visit Date: 06/29/2021 01:16 pm ---------------------------------------------------------------------- Performed By  Attending:        Tama High MD        Ref. Address:     [address]  Performed By:     Rodrigo Ran BS      [facility]                                                             [place] [facility]                                                             [facility]  Referred By:      [facility] ---------------------------------------------------------------------- Orders  #  Description                           Code        Ordered By  1  [place] MFM OB DETAIL +14 WK               76811.01    Darron Doom ----------------------------------------------------------------------  #  Order #                     Accession #                Episode #  1  [id number]                   [id number]                 [id number] ---------------------------------------------------------------------- Indications  Obesity complicating pregnancy, second         O99.212  trimester (pregravid BMI 45)  Abnormal finding on antenatal screening        O28.9  (low fetal fraction NIPS)  Previous cesarean delivery, antepartum         O34.219  [redacted] weeks gestation of pregnancy                Z3A.17  Antenatal screening for malformations           Z36.3  Neg Horizon 4 ---------------------------------------------------------------------- Fetal Evaluation  Num Of Fetuses:         1  Fetal Heart Rate(bpm):  148  Cardiac Activity:       Observed  Presentation:           Cephalic  Placenta:               Anterior  P. Cord Insertion:      Visualized  Amniotic Fluid  AFI FV:      Within normal limits                              Largest Pocket(cm)                              4.4 ---------------------------------------------------------------------- Biometry  BPD:      38.6  mm     G. Age:  17w 5d         46  %  CI:        67.56   %    70 - 86                                                          FL/HC:      16.2   %    15.8 - 18  HC:      150.3  mm     G. Age:  18w 1d         54  %    HC/AC:      1.16        1.07 - 1.29  AC:       130   mm     G. Age:  18w 4d         71  %    FL/BPD:     63.0   %  FL:       24.3  mm     G. Age:  17w 2d         25  %    FL/AC:      18.7   %    20 - 24  CER:      18.2  mm     G. Age:  18w 0d         62  %  NFT:       5.2  mm  LV:          6  mm  CM:        3.7  mm  Est. FW:     218  gm      0 lb 8 oz     52  % ---------------------------------------------------------------------- OB History  Gravidity:    2         Term:   1        Prem:   0        SAB:   0  TOP:          0       Ectopic:  0        Living: 1 ---------------------------------------------------------------------- Gestational Age  LMP:           22w 1d        Date:  01/25/21                 EDD:   11/01/21  U/S Today:     18w 0d                                        EDD:   11/30/21  Best:          17w 6d     Det. By:  U/S C R L  (04/11/21)    EDD:   12/01/21 ---------------------------------------------------------------------- Anatomy  Cranium:               Appears normal         Aortic Arch:            Not well visualized  Cavum:                 Appears normal  Ductal Arch:            Appears normal  Ventricles:            Appears normal          Diaphragm:              Appears normal  Choroid Plexus:        Appears normal         Stomach:                Appears normal, left                                                                        sided  Cerebellum:            Appears normal         Abdomen:                Appears normal  Posterior Fossa:       Appears normal         Abdominal Wall:         Appears nml (cord                                                                        insert, abd wall)  Nuchal Fold:           Appears normal         Cord Vessels:           Appears normal (3                                                                        vessel cord)  Face:                  Orbits nl; profile not Kidneys:                Appear normal                         well visualized  Lips:                  Not well visualized    Bladder:                Appears normal  Thoracic:              Appears normal         Spine:                  Limited views  appear normal  Heart:                 Appears normal         Upper Extremities:      Appears normal                         (4CH, axis, and                         situs)  RVOT:                  Appears normal         Lower Extremities:      Appears normal  LVOT:                  Appears normal  Other:  Nasal bone, lenses and heels/feet visualized. Fetus appears to be          female. ---------------------------------------------------------------------- Cervix Uterus Adnexa  Cervix  Length:           4.18  cm.  Normal appearance by transabdominal scan.  Uterus  No abnormality visualized.  Right Ovary  Not visualized.  Left Ovary  Not visualized.  Cul De Sac  No free fluid seen.  Adnexa  No abnormality visualized. ---------------------------------------------------------------------- Impression  G2 P1. Patient is here for fetal anatomy scan.  On cell-free fetal DNA screening, no results were reported  because of low fetal  fraction.  Obstetric history significant for a term cesarean delivery.  We performed fetal anatomy scan. No makers of  aneuploidies or fetal structural defects are seen. Fetal  biometry is consistent with he

## 2021-07-17 ENCOUNTER — Other Ambulatory Visit: Payer: Self-pay | Admitting: Family Medicine

## 2021-07-17 ENCOUNTER — Encounter: Payer: Self-pay | Admitting: Obstetrics & Gynecology

## 2021-07-17 ENCOUNTER — Encounter: Payer: BC Managed Care – PPO | Admitting: Obstetrics & Gynecology

## 2021-07-17 LAB — CBC
Hematocrit: 38.6 % (ref 34.0–46.6)
Hemoglobin: 12.5 g/dL (ref 11.1–15.9)
MCH: 26 pg — ABNORMAL LOW (ref 26.6–33.0)
MCHC: 32.4 g/dL (ref 31.5–35.7)
MCV: 80 fL (ref 79–97)
Platelets: 256 10*3/uL (ref 150–450)
RBC: 4.8 x10E6/uL (ref 3.77–5.28)
RDW: 14.3 % (ref 11.7–15.4)
WBC: 9.9 10*3/uL (ref 3.4–10.8)

## 2021-07-17 LAB — COMPREHENSIVE METABOLIC PANEL
ALT: 10 IU/L (ref 0–32)
AST: 8 IU/L (ref 0–40)
Albumin/Globulin Ratio: 1.3 (ref 1.2–2.2)
Albumin: 3.7 g/dL — ABNORMAL LOW (ref 3.8–4.8)
Alkaline Phosphatase: 116 IU/L (ref 44–121)
BUN/Creatinine Ratio: 16 (ref 9–23)
BUN: 6 mg/dL (ref 6–20)
Bilirubin Total: <0.2 mg/dL (ref 0.0–1.2)
CO2: 19 mmol/L — ABNORMAL LOW (ref 20–29)
Calcium: 8.9 mg/dL (ref 8.7–10.2)
Chloride: 103 mmol/L (ref 96–106)
Creatinine, Ser: 0.38 mg/dL — ABNORMAL LOW (ref 0.57–1.00)
Globulin, Total: 2.8 g/dL (ref 1.5–4.5)
Glucose: 88 mg/dL (ref 70–99)
Potassium: 4.3 mmol/L (ref 3.5–5.2)
Sodium: 137 mmol/L (ref 134–144)
Total Protein: 6.5 g/dL (ref 6.0–8.5)
eGFR: 135 mL/min/{1.73_m2} (ref >59)

## 2021-07-17 LAB — T4, FREE: Free T4: 2.11 ng/dL — ABNORMAL HIGH (ref 0.82–1.77)

## 2021-07-17 LAB — T3, FREE: T3, Free: 9.2 pg/mL — ABNORMAL HIGH (ref 2.0–4.4)

## 2021-07-17 LAB — TSH: TSH: <0.005 u[IU]/mL — ABNORMAL LOW (ref 0.450–4.500)

## 2021-07-18 ENCOUNTER — Encounter: Payer: Self-pay | Admitting: Obstetrics & Gynecology

## 2021-07-18 DIAGNOSIS — R7989 Other specified abnormal findings of blood chemistry: Secondary | ICD-10-CM | POA: Insufficient documentation

## 2021-07-18 LAB — THYROID PEROXIDASE ANTIBODY: Thyroperoxidase Ab SerPl-aCnc: 504 IU/mL — ABNORMAL HIGH (ref 0–34)

## 2021-07-18 LAB — THYROID STIMULATING IMMUNOGLOBULIN: Thyroid Stim Immunoglobulin: <0.1 IU/L (ref 0.00–0.55)

## 2021-07-18 LAB — SPECIMEN STATUS REPORT

## 2021-07-21 ENCOUNTER — Encounter: Payer: Self-pay | Admitting: Obstetrics & Gynecology

## 2021-07-21 DIAGNOSIS — E0500 Thyrotoxicosis with diffuse goiter without thyrotoxic crisis or storm: Secondary | ICD-10-CM

## 2021-07-21 DIAGNOSIS — E0590 Thyrotoxicosis, unspecified without thyrotoxic crisis or storm: Secondary | ICD-10-CM | POA: Insufficient documentation

## 2021-07-21 HISTORY — DX: Thyrotoxicosis with diffuse goiter without thyrotoxic crisis or storm: E05.00

## 2021-07-21 MED ORDER — METHIMAZOLE 5 MG PO TABS: 5.0000 mg | ORAL_TABLET | Freq: Three times a day (TID) | ORAL | 2 refills | Status: DC

## 2021-07-21 NOTE — Addendum Note (Signed)
Addended by: Jaynie Collins A on: 07/21/2021 01:13 PM   Modules accepted: Orders

## 2021-07-21 NOTE — Progress Notes (Signed)
Increased thyroperoxidase antibodies is diagnostic of Graves Disease.  Patient needs to be treated, will start Methimazole 5 mg daily as per consultation with Dr. Adrian Blackwater [facility]) and recheck thyroid labs in 4-6 weeks. Will also refer to Endocrinology for further evaluation and management, referral order placed.  Already has growth scan schedule on 07/26/2021.  Attempted to call patient to inform her of this there was no answer.  Problem list updated.  Please call to inform patient of results and recommendations.   Jaynie Collins, MD

## 2021-07-23 ENCOUNTER — Encounter: Payer: Self-pay | Admitting: Obstetrics & Gynecology

## 2021-07-23 ENCOUNTER — Telehealth: Payer: Self-pay | Admitting: *Deleted

## 2021-07-23 NOTE — Addendum Note (Signed)
Addended by: Verita Schneiders A on: 07/23/2021 12:08 PM   Modules accepted: Orders

## 2021-07-23 NOTE — Telephone Encounter (Signed)
-----   Message from Tereso Newcomer, MD sent at 07/21/2021  1:12 PM EDT ----- Increased thyroperoxidase antibodies is diagnostic of Graves Disease.  Patient needs to be treated, will start Methimazole 5 mg daily as per consultation with Dr. Adrian Blackwater [facility]) and recheck thyroid labs in 4-6 weeks. Will also refer to Endocrinology for further evaluation and management, referral order placed.  Already has growth scan schedule on 07/26/2021.  Attempted to call patient to inform her of this there was no answer.  Problem list updated.  Please call to inform patient of results and recommendations.   Jaynie Collins, MD

## 2021-07-23 NOTE — Progress Notes (Signed)
Called patient and talked to her about Graves diagnosis, need for Methimazole therapy. All questions answered.  Already placed referral to Endocrinology, now placed referral for consult with MFM (has scan scheduled on 07/26/21). Will follow up recommendations.   Verita Schneiders, MD, [place] for [employer], [place]

## 2021-07-23 NOTE — Telephone Encounter (Signed)
Pt informed of results and recommendations, will have Dr A try to reach out to pt again to answer her questions.

## 2021-07-24 ENCOUNTER — Encounter: Payer: Self-pay | Admitting: Obstetrics & Gynecology

## 2021-07-24 DIAGNOSIS — E0590 Thyrotoxicosis, unspecified without thyrotoxic crisis or storm: Secondary | ICD-10-CM

## 2021-07-24 DIAGNOSIS — E0500 Thyrotoxicosis with diffuse goiter without thyrotoxic crisis or storm: Secondary | ICD-10-CM

## 2021-07-25 MED ORDER — PROPYLTHIOURACIL 50 MG PO TABS: 50.0000 mg | ORAL_TABLET | Freq: Two times a day (BID) | ORAL | 0 refills | Status: DC

## 2021-07-26 ENCOUNTER — Other Ambulatory Visit: Payer: Self-pay | Admitting: *Deleted

## 2021-07-26 ENCOUNTER — Ambulatory Visit: Payer: Medicaid Other | Attending: Obstetrics and Gynecology

## 2021-07-26 ENCOUNTER — Ambulatory Visit: Payer: Medicaid Other | Admitting: *Deleted

## 2021-07-26 VITALS — BP 133/46 | HR 68

## 2021-07-26 DIAGNOSIS — Z98891 History of uterine scar from previous surgery: Secondary | ICD-10-CM | POA: Insufficient documentation

## 2021-07-26 DIAGNOSIS — Z6841 Body Mass Index (BMI) 40.0 and over, adult: Secondary | ICD-10-CM

## 2021-07-26 DIAGNOSIS — O0990 Supervision of high risk pregnancy, unspecified, unspecified trimester: Secondary | ICD-10-CM | POA: Diagnosis present

## 2021-07-26 DIAGNOSIS — E669 Obesity, unspecified: Secondary | ICD-10-CM

## 2021-07-26 DIAGNOSIS — O99282 Endocrine, nutritional and metabolic diseases complicating pregnancy, second trimester: Secondary | ICD-10-CM | POA: Diagnosis not present

## 2021-07-26 DIAGNOSIS — O288 Other abnormal findings on antenatal screening of mother: Secondary | ICD-10-CM | POA: Diagnosis not present

## 2021-07-26 DIAGNOSIS — O99212 Obesity complicating pregnancy, second trimester: Secondary | ICD-10-CM | POA: Insufficient documentation

## 2021-07-26 DIAGNOSIS — E0590 Thyrotoxicosis, unspecified without thyrotoxic crisis or storm: Secondary | ICD-10-CM

## 2021-07-26 DIAGNOSIS — O358XX0 Maternal care for other (suspected) fetal abnormality and damage, not applicable or unspecified: Secondary | ICD-10-CM

## 2021-07-26 DIAGNOSIS — O34219 Maternal care for unspecified type scar from previous cesarean delivery: Secondary | ICD-10-CM

## 2021-07-26 DIAGNOSIS — Z3A21 21 weeks gestation of pregnancy: Secondary | ICD-10-CM

## 2021-07-26 DIAGNOSIS — O280 Abnormal hematological finding on antenatal screening of mother: Secondary | ICD-10-CM

## 2021-07-26 DIAGNOSIS — O285 Abnormal chromosomal and genetic finding on antenatal screening of mother: Secondary | ICD-10-CM | POA: Insufficient documentation

## 2021-08-16 ENCOUNTER — Other Ambulatory Visit: Payer: Self-pay | Admitting: Obstetrics & Gynecology

## 2021-08-16 DIAGNOSIS — E0590 Thyrotoxicosis, unspecified without thyrotoxic crisis or storm: Secondary | ICD-10-CM

## 2021-08-16 DIAGNOSIS — E0500 Thyrotoxicosis with diffuse goiter without thyrotoxic crisis or storm: Secondary | ICD-10-CM

## 2021-08-21 ENCOUNTER — Ambulatory Visit (INDEPENDENT_AMBULATORY_CARE_PROVIDER_SITE_OTHER): Payer: Medicaid Other | Admitting: Obstetrics & Gynecology

## 2021-08-21 ENCOUNTER — Encounter: Payer: Self-pay | Admitting: Obstetrics & Gynecology

## 2021-08-21 VITALS — BP 137/90 | HR 97 | Wt 285.4 lb

## 2021-08-21 DIAGNOSIS — O26892 Other specified pregnancy related conditions, second trimester: Secondary | ICD-10-CM

## 2021-08-21 DIAGNOSIS — O99280 Endocrine, nutritional and metabolic diseases complicating pregnancy, unspecified trimester: Secondary | ICD-10-CM

## 2021-08-21 DIAGNOSIS — E0590 Thyrotoxicosis, unspecified without thyrotoxic crisis or storm: Secondary | ICD-10-CM

## 2021-08-21 DIAGNOSIS — O0990 Supervision of high risk pregnancy, unspecified, unspecified trimester: Secondary | ICD-10-CM

## 2021-08-21 DIAGNOSIS — E0500 Thyrotoxicosis with diffuse goiter without thyrotoxic crisis or storm: Secondary | ICD-10-CM

## 2021-08-21 DIAGNOSIS — R12 Heartburn: Secondary | ICD-10-CM

## 2021-08-21 DIAGNOSIS — Z3A25 25 weeks gestation of pregnancy: Secondary | ICD-10-CM

## 2021-08-21 MED ORDER — PANTOPRAZOLE SODIUM 40 MG PO TBEC: 40.0000 mg | DELAYED_RELEASE_TABLET | Freq: Every day | ORAL | 2 refills | Status: DC

## 2021-08-21 NOTE — Patient Instructions (Addendum)
Return to office for any scheduled appointments. Call the office or go to the MAU at [facility] [place] if: You begin to have strong, frequent contractions Your water breaks.  Sometimes it is a big gush of fluid, sometimes it is just a trickle that keeps getting your underwear wet or running down your legs You have vaginal bleeding.  It is normal to have a small amount of spotting if your cervix was checked.  You do not feel your baby moving like normal.  If you do not, get something to eat and drink and lay down and focus on feeling your baby move.   If your baby is still not moving like normal, you should call the office or go to MAU. Any other obstetric concerns.   TDaP Vaccine Pregnancy Get the Whooping Cough Vaccine While You Are Pregnant (CDC)  It is important for women to get the whooping cough vaccine in the third trimester of each pregnancy. Vaccines are the best way to prevent this disease. There are 2 different whooping cough vaccines. Both vaccines combine protection against whooping cough, tetanus and diphtheria, but they are for different age groups: Tdap: for everyone 11 years or older, including pregnant women  DTaP: for children 2 months through 6 years of age  You need the whooping cough vaccine during each of your pregnancies The recommended time to get the shot is during your 27th through 36th week of pregnancy, preferably during the earlier part of this time period. The Centers for Disease Control and Prevention (CDC) recommends that pregnant women receive the whooping cough vaccine for adolescents and adults (called Tdap vaccine) during the third trimester of each pregnancy. The recommended time to get the shot is during your 27th through 36th week of pregnancy, preferably during the earlier part of this time period. This replaces the original recommendation that pregnant women get the vaccine only if they had not previously received it. The American  College of Obstetricians and Gynecologists and the American College of Nurse-Midwives support this recommendation.  You should get the whooping cough vaccine while pregnant to pass protection to your baby frame support disabled and/or not supported in this browser  Learn why Rachel Mendez decided to get the whooping cough vaccine in her 3rd trimester of pregnancy and how her baby girl was born with some protection against the disease. Also available on YouTube. After receiving the whooping cough vaccine, your body will create protective antibodies (proteins produced by the body to fight off diseases) and pass some of them to your baby before birth. These antibodies provide your baby some short-term protection against whooping cough in early life. These antibodies can also protect your baby from some of the more serious complications that come along with whooping cough. Your protective antibodies are at their highest about 2 weeks after getting the vaccine, but it takes time to pass them to your baby. So the preferred time to get the whooping cough vaccine is early in your third trimester. The amount of whooping cough antibodies in your body decreases over time. That is why CDC recommends you get a whooping cough vaccine during each pregnancy. Doing so allows each of your babies to get the greatest number of protective antibodies from you. This means each of your babies will get the best protection possible against this disease.  Getting the whooping cough vaccine while pregnant is better than getting the vaccine after you give birth Whooping cough vaccination during pregnancy is ideal so   your baby will have short-term protection as soon as he is born. This early protection is important because your baby will not start getting his whooping cough vaccines until he is 2 months old. These first few months of life are when your baby is at greatest risk for catching whooping cough. This is also when he's at greatest  risk for having severe, potentially life-threating complications from the infection. To avoid that gap in protection, it is best to get a whooping cough vaccine during pregnancy. You will then pass protection to your baby before he is born. To continue protecting your baby, he should get whooping cough vaccines starting at 2 months old. You may never have gotten the Tdap vaccine before and did not get it during this pregnancy. If so, you should make sure to get the vaccine immediately after you give birth, before leaving the hospital or birthing center. It will take about 2 weeks before your body develops protection (antibodies) in response to the vaccine. Once you have protection from the vaccine, you are less likely to give whooping cough to your newborn while caring for him. But remember, your baby will still be at risk for catching whooping cough from others. A recent study looked to see how effective Tdap was at preventing whooping cough in babies whose mothers got the vaccine while pregnant or in the hospital after giving birth. The study found that getting Tdap between 27 through 36 weeks of pregnancy is 85% more effective at preventing whooping cough in babies younger than 2 months old. Blood tests cannot tell if you need a whooping cough vaccine There are no blood tests that can tell you if you have enough antibodies in your body to protect yourself or your baby against whooping cough. Even if you have been sick with whooping cough in the past or previously received the vaccine, you still should get the vaccine during each pregnancy. Breastfeeding may pass some protective antibodies onto your baby By breastfeeding, you may pass some antibodies you have made in response to the vaccine to your baby. When you get a whooping cough vaccine during your pregnancy, you will have antibodies in your breast milk that you can share with your baby as soon as your milk comes in. However, your baby will not get  protective antibodies immediately if you wait to get the whooping cough vaccine until after delivering your baby. This is because it takes about 2 weeks for your body to create antibodies. Learn more about the health benefits of breastfeeding.  

## 2021-08-21 NOTE — Progress Notes (Addendum)
   PRENATAL VISIT NOTE  Subjective:  Rachel Mendez is a 36 y.o. G2P1001 at [redacted]w[redacted]d being seen today for ongoing prenatal care.  She is currently monitored for the following issues for this high-risk pregnancy and has Supervision of high risk pregnancy, antepartum; Previous cesarean section; Maternal morbid obesity, antepartum (HCC); Mixed incontinence; Anxiety; Depression; ADD (attention deficit disorder); Asthma; Low thyroid stimulating hormone (TSH) level; Hyperthyroidism in pregnancy, antepartum; and Graves disease on their problem list.  Patient reports heartburn, desires refill of Protonix.  Contractions: Irritability. Vag. Bleeding: None.  Movement: Present. Denies leaking of fluid.   The following portions of the patient's history were reviewed and updated as appropriate: allergies, current medications, past family history, past medical history, past social history, past surgical history and problem list.   Objective:   Vitals:   08/21/21 1545  BP: 137/90  Pulse: 97  Weight: 285 lb 6.4 oz (129.5 kg)    Fetal Status: Fetal Heart Rate (bpm): 141   Movement: Present     General:  Alert, oriented and cooperative. Patient is in no acute distress.  Skin: Skin is warm and dry. No rash noted.   Cardiovascular: Normal heart rate noted  Respiratory: Normal respiratory effort, no problems with respiration noted  Abdomen: Soft, gravid, appropriate for gestational age.  Pain/Pressure: Present     Pelvic: Cervical exam deferred        Extremities: Normal range of motion.  Edema: Mild pitting, slight indentation  Mental Status: Normal mood and affect. Normal behavior. Normal judgment and thought content.   Assessment and Plan:  Pregnancy: G2P1001 at [redacted]w[redacted]d 1. Hyperthyroidism in pregnancy, antepartum 2. Graves disease Continue PTU 50 mg twice a day for now. Will check labs and adjust dose accordingly. Has not been called by Endocrinology office yet, we will follow up on this referral Will  have consult with MFM and ultrasound on 08/23/21, will follow up recommendations. - TSH; Future - T3, free; Future - T4, free; Future - Thyroid peroxidase antibody; Future - Thyroid stimulating immunoglobulin; Future  3. Heartburn during pregnancy in second trimester Protonix refilled.  - pantoprazole (PROTONIX) 40 MG tablet; Take 1 tablet (40 mg total) by mouth daily.  Dispense: 90 tablet; Refill: 2  4. [redacted] weeks gestation of pregnancy 5. Supervision of high risk pregnancy, antepartum Preterm labor symptoms and general obstetric precautions including but not limited to vaginal bleeding, contractions, leaking of fluid and fetal movement were reviewed in detail with the patient. Please refer to After Visit Summary for other counseling recommendations.   Return in about 3 weeks (around 09/11/2021) for 2 hr GTT, 3rd trimester labs, TDap, OFFICE OB VISIT (MD only).  Future Appointments  Date Time Provider Department Center  08/23/2021 10:30 AM [facility] NURSE [facility]  08/23/2021 10:45 AM [facility] US6 [facility]  08/23/2021 11:00 AM [facility] MD [facility]    Jaynie Collins, MD

## 2021-08-23 ENCOUNTER — Encounter: Payer: Self-pay | Admitting: *Deleted

## 2021-08-23 ENCOUNTER — Ambulatory Visit: Payer: Medicaid Other | Admitting: *Deleted

## 2021-08-23 ENCOUNTER — Ambulatory Visit (HOSPITAL_BASED_OUTPATIENT_CLINIC_OR_DEPARTMENT_OTHER): Payer: Medicaid Other | Admitting: Obstetrics and Gynecology

## 2021-08-23 ENCOUNTER — Other Ambulatory Visit: Payer: Self-pay | Admitting: *Deleted

## 2021-08-23 ENCOUNTER — Ambulatory Visit: Payer: Medicaid Other | Attending: Obstetrics

## 2021-08-23 VITALS — BP 126/69 | HR 75

## 2021-08-23 DIAGNOSIS — Z98891 History of uterine scar from previous surgery: Secondary | ICD-10-CM

## 2021-08-23 DIAGNOSIS — O99212 Obesity complicating pregnancy, second trimester: Secondary | ICD-10-CM

## 2021-08-23 DIAGNOSIS — O0992 Supervision of high risk pregnancy, unspecified, second trimester: Secondary | ICD-10-CM | POA: Insufficient documentation

## 2021-08-23 DIAGNOSIS — O0990 Supervision of high risk pregnancy, unspecified, unspecified trimester: Secondary | ICD-10-CM | POA: Diagnosis present

## 2021-08-23 DIAGNOSIS — Z3A25 25 weeks gestation of pregnancy: Secondary | ICD-10-CM

## 2021-08-23 DIAGNOSIS — O34219 Maternal care for unspecified type scar from previous cesarean delivery: Secondary | ICD-10-CM | POA: Diagnosis not present

## 2021-08-23 DIAGNOSIS — O99282 Endocrine, nutritional and metabolic diseases complicating pregnancy, second trimester: Secondary | ICD-10-CM | POA: Diagnosis not present

## 2021-08-23 DIAGNOSIS — Z6841 Body Mass Index (BMI) 40.0 and over, adult: Secondary | ICD-10-CM

## 2021-08-23 DIAGNOSIS — E0590 Thyrotoxicosis, unspecified without thyrotoxic crisis or storm: Secondary | ICD-10-CM

## 2021-08-23 DIAGNOSIS — O280 Abnormal hematological finding on antenatal screening of mother: Secondary | ICD-10-CM | POA: Insufficient documentation

## 2021-08-23 DIAGNOSIS — Z362 Encounter for other antenatal screening follow-up: Secondary | ICD-10-CM | POA: Diagnosis not present

## 2021-08-23 DIAGNOSIS — Z3689 Encounter for other specified antenatal screening: Secondary | ICD-10-CM

## 2021-08-23 NOTE — Progress Notes (Signed)
Maternal-Fetal Medicine   Name: Rachel Mendez DOB: January 29, 1986 MRN: [id number] Referring Provider: Jaynie Collins, MD  I had the pleasure of seeing Rachel M[facility] at [facility]. She is G2 P1 at 25w 5d gestation and is here for fetal growth assessment. She has hyperthyroidism.  Hypothyroidism was diagnosed about 2 years ago when patient had symptoms of tiredness and lethargy.  She was taking phentermine for weight loss.  Since diagnosis, she has not taken antithyroid medications.  Patient reports she had fine-needle aspiration cytology of thyroid gland at 36 years of age and it was reported as normal. Patient does not report weight loss or palpitations or any cardiovascular symptoms. In this pregnancy, she takes PTU 50 mg twice daily.  Recent labs (07/16/2021 include: TSH <0.005, T4 2.11 (increased), T3 9.2 (increased), Thyroid-stimulating immunoglobulin (TSI) <0.1 (not increased), Thyroid peroxidase antibody (TPO) 504 (increased).  She does not have hypertension or diabetes or any chronic medical conditions.  She has mild intermittent asthma and takes albuterol inhalers as needed. Past surgical history: Cesarean section. Medications: PTU, prenatal vitamins, Zoloft 50 mg daily, albuterol as needed. Allergies: No known drug allergies. Social history: Denies tobacco or drug or alcohol use.  She is single and her partner is in good health.  He is not the father of her first child. GYN history: No history of abnormal Pap smears or cervical surgeries.  No history of breast disease. Obstetric history significant for a term cesarean delivery (failure to progress in labor) in 2019 of a female infant weighing 7 pounds and 14 ounces at birth. Prenatal course: On cell-free fetal DNA screening, the results could not be reported because of low fetal fraction.  Patient had opted not to have amniocentesis.  Ultrasound Fetal growth is appropriate for gestational age.  Amniotic fluid is  normal and good fetal activity seen.  Fetal heart rate and rhythm appear normal.  No evidence of fetal goiter.  Hyperthyroidism in pregnancy -Patient has hyperthyroidism, which is more-likely Graves' disease. Positive TPO antibodies are consistent with autoimmune stimulation of thyroid gland. -Poorly treated hyperthyroidism can lead to fetal growth restriction or preterm delivery.  Fetal/neonatal hyperthyroidism complicates about 5% of pregnancies and it is increased to up to 15 to 30% if TSI is increased. Recent TSI estimation showed no increase. Fetal hyperthyroidism can lead to fetal congestive heart failure, hydrops, and preterm delivery. -It is important to treat hyperthyroidism in pregnancy.  Antithyroid drugs (ATD) form the first-line treatment.  In the second trimester PTU or methimazole can be given.  Patient reports sensitivity to methimazole. -PTU can be associated with liver toxicity in some patients. Antithyroid drugs dosage should be adjusted to keep free thyroxine levels in the upper limits of normal.  It can be discontinued at [redacted] weeks gestation if T3 and FT4 levels are not increased. -I recommend checking TSI levels every trimester. -Beta-blockers should be added if patient has symptoms of tachycardia. -Hyperthyroidism can rarely be due to thyroid adenoma.  Although it is more likely that she has Graves' disease, she has not had ultrasound of the thyroid. -We recommend serial fetal growth assessments every 4 weeks and weekly BPP from [redacted] weeks gestation till delivery.  If TSI is increased, weekly ultrasound will be performed. -I recommend endocrinology consultation.  Previous cesarean delivery I explained that repeat cesarean deliveries increase the risk of placenta previa or placenta accreta spectrum.  Patient seems undecided about VBAC now.  Mode of delivery may be addressed in the third trimester.  Recommendations -An appointment was made for her to return in 4 weeks for fetal  growth assessment. Weekly BPP from [redacted] weeks gestation till delivery. -More frequent ultrasound (weekly) if TSI is increased. -Continue PTU and adjust dosage as necessary.  Keep T3 and free T4 levels in the upper limit of normal. -Consider discontinuation of antithyroid drugs at [redacted] weeks gestation (to prevent fetal/neonatal hypothyroidism) if free T4 levels are within normal range. -Thyroid ultrasound to rule out active nodules.   Thank you for consultation.  If you have any questions or concerns, please contact me the [facility].  Consultation including face-to-face (more than 50%) counseling 30 minutes.

## 2021-08-28 ENCOUNTER — Other Ambulatory Visit: Payer: Medicaid Other

## 2021-08-28 DIAGNOSIS — E0590 Thyrotoxicosis, unspecified without thyrotoxic crisis or storm: Secondary | ICD-10-CM

## 2021-08-28 DIAGNOSIS — E0500 Thyrotoxicosis with diffuse goiter without thyrotoxic crisis or storm: Secondary | ICD-10-CM

## 2021-08-29 ENCOUNTER — Encounter: Payer: Self-pay | Admitting: Obstetrics & Gynecology

## 2021-08-29 LAB — TSH: TSH: <0.005 u[IU]/mL — ABNORMAL LOW (ref 0.450–4.500)

## 2021-08-29 LAB — T4, FREE: Free T4: 1.28 ng/dL (ref 0.82–1.77)

## 2021-08-29 LAB — THYROID PEROXIDASE ANTIBODY: Thyroperoxidase Ab SerPl-aCnc: 439 IU/mL — ABNORMAL HIGH (ref 0–34)

## 2021-08-29 LAB — THYROID STIMULATING IMMUNOGLOBULIN: Thyroid Stim Immunoglobulin: <0.1 IU/L (ref 0.00–0.55)

## 2021-08-29 LAB — T3, FREE: T3, Free: 4.4 pg/mL (ref 2.0–4.4)

## 2021-09-11 ENCOUNTER — Ambulatory Visit (INDEPENDENT_AMBULATORY_CARE_PROVIDER_SITE_OTHER): Payer: Medicaid Other | Admitting: Obstetrics & Gynecology

## 2021-09-11 ENCOUNTER — Other Ambulatory Visit: Payer: Medicaid Other

## 2021-09-11 VITALS — BP 131/79 | HR 90 | Wt 285.8 lb

## 2021-09-11 DIAGNOSIS — O0990 Supervision of high risk pregnancy, unspecified, unspecified trimester: Secondary | ICD-10-CM

## 2021-09-11 DIAGNOSIS — Z3A28 28 weeks gestation of pregnancy: Secondary | ICD-10-CM

## 2021-09-11 DIAGNOSIS — Z302 Encounter for sterilization: Secondary | ICD-10-CM

## 2021-09-11 DIAGNOSIS — O99280 Endocrine, nutritional and metabolic diseases complicating pregnancy, unspecified trimester: Secondary | ICD-10-CM

## 2021-09-11 DIAGNOSIS — O24419 Gestational diabetes mellitus in pregnancy, unspecified control: Secondary | ICD-10-CM

## 2021-09-11 DIAGNOSIS — Z98891 History of uterine scar from previous surgery: Secondary | ICD-10-CM

## 2021-09-11 DIAGNOSIS — E0590 Thyrotoxicosis, unspecified without thyrotoxic crisis or storm: Secondary | ICD-10-CM

## 2021-09-11 NOTE — Progress Notes (Signed)
PRENATAL VISIT NOTE  Subjective:  Rachel Mendez is a 36 y.o. G2P1001 at [redacted]w[redacted]d being seen today for ongoing prenatal care.  She is currently monitored for the following issues for this high-risk pregnancy and has Supervision of high risk pregnancy, antepartum; Previous cesarean section; Maternal morbid obesity, antepartum (HCC); Mixed incontinence; Anxiety; Depression; ADD (attention deficit disorder); Asthma; Low thyroid stimulating hormone (TSH) level; Hyperthyroidism in pregnancy, antepartum; Graves disease; and Request for sterilization on their problem list.  Patient reports no complaints.  Contractions: Irritability. Vag. Bleeding: None.  Movement: Present. Denies leaking of fluid.   The following portions of the patient's history were reviewed and updated as appropriate: allergies, current medications, past family history, past medical history, past social history, past surgical history and problem list.   Objective:   Vitals:   09/11/21 0819  BP: 131/79  Pulse: 90  Weight: 285 lb 12.8 oz (129.6 kg)    Fetal Status: Fetal Heart Rate (bpm): 126   Movement: Present     General:  Alert, oriented and cooperative. Patient is in no acute distress.  Skin: Skin is warm and dry. No rash noted.   Cardiovascular: Normal heart rate noted  Respiratory: Normal respiratory effort, no problems with respiration noted  Abdomen: Soft, gravid, appropriate for gestational age.  Pain/Pressure: Absent     Pelvic: Cervical exam deferred        Extremities: Normal range of motion.     Mental Status: Normal mood and affect. Normal behavior. Normal judgment and thought content.   [place] MFM OB FOLLOW UP  Result Date: 08/23/2021 ----------------------------------------------------------------------  OBSTETRICS REPORT                       (Signed Final 08/23/2021 05:54 pm) ---------------------------------------------------------------------- Patient Info  ID #:       [id number]                           D.O.B.:  [birth year] (36 yrs)  Name:       Rachel Mendez                   Visit Date: 08/23/2021 10:59 am ---------------------------------------------------------------------- Performed By  Attending:        Noralee Space MD        Ref. Address:     [address]                                                             [address]  Performed By:     Emeline Darling BS,      [facility]  Referred By:      [facility] ---------------------------------------------------------------------- Orders  #  Description                           Code        Ordered By  1  [place] MFM OB FOLLOW UP                   [phone]    Peterson Ao ----------------------------------------------------------------------  #  Order #                     Accession #                Episode #  1  [id number]                   [id number]                 [id number] ---------------------------------------------------------------------- Indications  Hyperthyroid                                   O99.280 0000000  Obesity complicating pregnancy, second         O99.212  trimester (pregravid BMI 45)  Abnormal finding on antenatal screening        O28.9  (low fetal fraction NIPS)  Previous cesarean delivery, antepartum         O34.219  Encounter for other antenatal screening        Z36.2  follow-up  Neg Horizon 4  [redacted] weeks gestation of pregnancy                Z3A.25 ---------------------------------------------------------------------- Fetal Evaluation  Num Of Fetuses:         1  Fetal Heart Rate(bpm):  143  Cardiac Activity:       Observed  Presentation:           Breech  Placenta:               Anterior  P. Cord Insertion:      Previously Visualized  Amniotic Fluid  AFI FV:      Within normal limits                               Largest Pocket(cm)                              7.06 ---------------------------------------------------------------------- Biometry  BPD:      65.1  mm     G. Age:  26w 2d         61  %    CI:        71.34   %    70 - 86  FL/HC:      20.2   %    18.6 - 20.4  HC:    245.48   mm     G. Age:  26w 5d         60  %    HC/AC:      1.09        1.04 - 1.22  AC:    224.67   mm     G. Age:  26w 6d         76  %    FL/BPD:     76.3   %    71 - 87  FL:       49.7  mm     G. Age:  26w 5d         69  %    FL/AC:      22.1   %    20 - 24  Est. FW:     982  gm      2 lb 3 oz     83  % ---------------------------------------------------------------------- OB History  Gravidity:    2         Term:   1        Prem:   0        SAB:   0  TOP:          0       Ectopic:  0        Living: 1 ---------------------------------------------------------------------- Gestational Age  LMP:           30w 0d        Date:  01/25/21                 EDD:   11/01/21  U/S Today:     26w 5d                                        EDD:   11/24/21  Best:          25w 5d     Det. By:  U/S C R L  (04/11/21)    EDD:   12/01/21 ---------------------------------------------------------------------- Anatomy  Cranium:               Appears normal         Aortic Arch:            Not well visualized  Cavum:                 Appears normal         Ductal Arch:            Previously seen  Ventricles:            Appears normal         Diaphragm:              Appears normal  Choroid Plexus:        Previously seen        Stomach:                Appears normal, left  sided  Cerebellum:            Appears normal         Abdomen:                Previously seen  Posterior Fossa:       Previously seen        Abdominal Wall:         Previously seen  Nuchal Fold:           Not applicable ([id number]    Cord Vessels:           Previously seen                          wks GA)  Face:                  profile not well       Kidneys:                Appear normal                         visualized  Lips:                  Appears normal         Bladder:                Appears normal  Thoracic:              Appears normal         Spine:                  Previously seen  Heart:                 Appears normal         Upper Extremities:      Previously seen                         (4CH, axis, and                         situs)  RVOT:                  Previously seen        Lower Extremities:      Previously seen  LVOT:                  Previously seen  Other:  Nasal bone, lenses and heels/feet previously visualized. Fetus          appears to be female. Technically difficult due to maternal habitus. ---------------------------------------------------------------------- Cervix Uterus Adnexa  Cervix  Not visualized (advanced GA >24wks) ---------------------------------------------------------------------- Impression  Fetal growth is appropriate for gestational age.  Amniotic fluid  is normal and good fetal activity seen.  Fetal heart rate and  rhythm appear normal.  No evidence of fetal goiter.  xxxxxxxxxxxxxxxxxxxxxxxxxxxxxxxxxxxxxxxxxxxxxxxxxxxxxxxxx  xxx  Consultation (see EPIC )  I had the pleasure of seeing Ms. Felderman today at the [facility]. She is G2 P1 at 25w 5d gestation  and is here for fetal growth assessment. She has  hyperthyroidism.  Hypothyroidism was diagnosed about 2 years ago when  patient had symptoms of tiredness and lethargy.  She was  taking phentermine for weight loss.  Since diagnosis, she has  not  taken antithyroid medications.  Patient reports she had  fine-needle aspiration cytology of thyroid gland at 36 years of  age and it was reported as normal.  Patient does not report weight loss or palpitations or any  cardiovascular symptoms.  In this pregnancy, she takes PTU 50 mg twice daily.  Recent  labs (07/16/2021 include:  TSH <0.005, T4 2.11  (increased), T3 9.2 (increased), Thyroid-  stimulating immunoglobulin (TSI) <0.1 (not increased),  Thyroid peroxidase antibody (TPO) 504 (increased).  She does not have hypertension or diabetes or any chronic  medical conditions.  She has mild intermittent asthma and  takes albuterol inhalers as needed.  Past surgical history: Cesarean section.  Medications: PTU, prenatal vitamins, Zoloft 50 mg daily,  albuterol as needed.  Allergies: No known drug allergies.  Social history: Denies tobacco or drug or alcohol use.  She is  single and her partner is in good health.  He is not the father  of her first child.  GYN history: No history of abnormal Pap smears or cervical  surgeries.  No history of breast disease.  Obstetric history significant for a term cesarean delivery  (failure to progress in labor) in 2019 of a female infant weighing  7 pounds and 14 ounces at birth.  Prenatal course: On cell-free fetal DNA screening, the results  could not be reported because of low fetal fraction.  Patient  had opted not to have amniocentesis.  Hyperthyroidism in pregnancy  -Patient has hyperthyroidism, which is more-likely Graves'  disease. 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  -Poorly treated hyperthyroidism can lead to fetal growth  restriction or preterm delivery.  Fetal/neonatal  hyperthyroidism complicates about 5% of pregnancies and it  is increased to up to 15 to 30% if TSI is increased. Recent  TSI estimation showed no increase. Fetal hyperthyroidism  can lead to fetal congestive heart failure, hydrops, and  preterm delivery.  -It is important to treat hyperthyroidism in pregnancy.  Antithyroid drugs (ATD) form the first-line treatment.  In the  second trimester PTU or methimazole can be given.  Patient  reports sensitivity to methimazole.  -PTU can be associated with liver toxicity in some patients.  Antithyroid drugs dosage should be adjusted to keep free  thyroxine levels  in the upper limits of normal.  It can be  discontinued at [redacted] weeks gestation if T3 and FT4 levels are  not increased.  -I recommend checking TSI levels every trimester.  -Beta-blockers should be added if patient has symptoms of  tachycardia.  -Hyperthyroidism can rarely be due to thyroid adenoma.  Although it is more likely that she has Graves' disease, she  has not had ultrasound of the thyroid.  -We recommend serial fetal growth assessments every 4  weeks and weekly BPP from [redacted] weeks gestation till delivery.  If TSI is increased, weekly ultrasound will be performed.  -I recommend endocrinology consultation.  Previous cesarean delivery  I explained that repeat cesarean deliveries increase the risk  of placenta previa or placenta accreta spectrum.  Patient  seems undecided about VBAC now.  Mode of delivery may  be addressed in the third trimester. ---------------------------------------------------------------------- Recommendations  -An appointment was made for her to return in 4 weeks for  fetal growth assessment.  Weekly BPP from [redacted] weeks gestation till delivery.  -More frequent ultrasound (weekly) if TSI is increased.  -Continue PTU and adjust dosage as necessary.  Keep T3  and free T4 levels in the upper  limit of normal.  -Consider discontinuation of antithyroid drugs at [redacted] weeks  gestation (to prevent fetal/neonatal hypothyroidism) if free T4  levels are within normal range.  -Thyroid ultrasound to rule out active nodules. ----------------------------------------------------------------------                 Tama High, MD Electronically Signed Final Report   08/23/2021 05:54 pm ----------------------------------------------------------------------    Results for orders placed or performed in visit on 08/28/21 (from the past 504 hour(s))  Thyroid peroxidase antibody   Collection Time: 08/28/21  9:45 AM  Result Value Ref Range   Thyroperoxidase Ab SerPl-aCnc 439 (H) 0 - 34 IU/mL  T4, free    Collection Time: 08/28/21  9:45 AM  Result Value Ref Range   Free T4 1.28 0.82 - 1.77 ng/dL  T3, free   Collection Time: 08/28/21  9:45 AM  Result Value Ref Range   T3, Free 4.4 2.0 - 4.4 pg/mL  TSH   Collection Time: 08/28/21  9:45 AM  Result Value Ref Range   TSH <0.005 (L) 0.450 - 4.500 uIU/mL  Thyroid stimulating immunoglobulin   Collection Time: 08/28/21  9:45 AM  Result Value Ref Range   Thyroid Stim Immunoglobulin <0.10 0.00 - 0.55 IU/L    Assessment and Plan:  Pregnancy: G2P1001 at [redacted]w[redacted]d 1. Hyperthyroidism in pregnancy, antepartum Improved recent labs, continue PTU as prescribed. Has appointment with endocrinologist on 09/22/21, will follow up recommendations. Continue MFM recommended antenatal testing and scans.   2. Previous cesarean section Counseled regarding TOLAC vs RCS; risks/benefits discussed in detail. All questions answered.  Patient elects for TOLAC, consent signed 09/11/2021.  3. Request for sterilization Aware of permanence and irreversibility of this method, aware of reversible equally effective modalities (IUD, Nexplanon, vasectomy). Given habitus, patient aware that this will likely be done as interval BTL if she has a vaginal delivery. If she has a cesarean delivery, will do at time of C/S. Papers signed 09/11/21  4. [redacted] weeks gestation of pregnancy 5. Supervision of high risk pregnancy, antepartum Third trimester labs done today, will follow up results and manage accordingly. Patient  deferred Tdap to next visit. - Glucose Tolerance, 2 Hours w/1 Hour - CBC - RPR - HIV Antibody (routine testing w rflx) Preterm labor symptoms and general obstetric precautions including but not limited to vaginal bleeding, contractions, leaking of fluid and fetal movement were reviewed in detail with the patient. Please refer to After Visit Summary for other counseling recommendations.   Return in about 2 weeks (around 09/25/2021) for OFFICE OB VISIT (MD only).  Future  Appointments  Date Time Provider [place]  09/21/2021 10:30 AM [facility] NURSE [facility]  09/21/2021 10:45 AM [facility] US4 [facility]  09/25/2021  8:35 AM Linas Stepter, Sallyanne Havers, MD [facility]  10/04/2021 10:45 AM [facility] NURSE [facility]  10/04/2021 11:00 AM [facility] US1 [facility]  10/09/2021  8:35 AM Shyhiem Beeney, Sallyanne Havers, MD [facility]  10/11/2021 10:30 AM [facility] NURSE [facility]  10/11/2021 10:45 AM [facility] US5 [facility]  10/23/2021  8:35 AM Aletha Halim, MD [facility]  11/06/2021  8:35 AM Lycia Sachdeva, Sallyanne Havers, MD [facility]  11/13/2021  8:35 AM Harolyn Rutherford, Sallyanne Havers, MD [facility]  11/20/2021  8:35 AM Elly Modena, Vickii Chafe, MD [facility]  11/27/2021  8:35 AM Radene Gunning, MD [facility]    Verita Schneiders, MD

## 2021-09-11 NOTE — Patient Instructions (Addendum)
Return to office for any scheduled appointments. Call the office or go to the MAU at [facility] if: You begin to have strong, frequent contractions Your water breaks.  Sometimes it is a big gush of fluid, sometimes it is just a trickle that keeps getting your underwear wet or running down your legs You have vaginal bleeding.  It is normal to have a small amount of spotting if your cervix was checked.  You do not feel your baby moving like normal.  If you do not, get something to eat and drink and lay down and focus on feeling your baby move.   If your baby is still not moving like normal, you should call the office or go to MAU. Any other obstetric concerns.   TDaP Vaccine Pregnancy Get the Whooping Cough Vaccine While You Are Pregnant (CDC)  It is important for women to get the whooping cough vaccine in the third trimester of each pregnancy. Vaccines are the best way to prevent this disease. There are 2 different whooping cough vaccines. Both vaccines combine protection against whooping cough, tetanus and diphtheria, but they are for different age groups: Tdap: for everyone 11 years or older, including pregnant women  DTaP: for children 2 months through 6 years of age  You need the whooping cough vaccine during each of your pregnancies The recommended time to get the shot is during your 27th through 36th week of pregnancy, preferably during the earlier part of this time period. The Centers for Disease Control and Prevention (CDC) recommends that pregnant women receive the whooping cough vaccine for adolescents and adults (called Tdap vaccine) during the third trimester of each pregnancy. The recommended time to get the shot is during your 27th through 36th week of pregnancy, preferably during the earlier part of this time period. This replaces the original recommendation that pregnant women get the vaccine only if they had not previously received it. The American  College of Obstetricians and Gynecologists and the American College of Nurse-Midwives support this recommendation.  You should get the whooping cough vaccine while pregnant to pass protection to your baby frame support disabled and/or not supported in this browser  Learn why Laura decided to get the whooping cough vaccine in her 3rd trimester of pregnancy and how her baby girl was born with some protection against the disease. Also available on YouTube. After receiving the whooping cough vaccine, your body will create protective antibodies (proteins produced by the body to fight off diseases) and pass some of them to your baby before birth. These antibodies provide your baby some short-term protection against whooping cough in early life. These antibodies can also protect your baby from some of the more serious complications that come along with whooping cough. Your protective antibodies are at their highest about 2 weeks after getting the vaccine, but it takes time to pass them to your baby. So the preferred time to get the whooping cough vaccine is early in your third trimester. The amount of whooping cough antibodies in your body decreases over time. That is why CDC recommends you get a whooping cough vaccine during each pregnancy. Doing so allows each of your babies to get the greatest number of protective antibodies from you. This means each of your babies will get the best protection possible against this disease.  Getting the whooping cough vaccine while pregnant is better than getting the vaccine after you give birth Whooping cough vaccination during pregnancy is ideal so   your baby will have short-term protection as soon as he is born. This early protection is important because your baby will not start getting his whooping cough vaccines until he is 2 months old. These first few months of life are when your baby is at greatest risk for catching whooping cough. This is also when he's at greatest  risk for having severe, potentially life-threating complications from the infection. To avoid that gap in protection, it is best to get a whooping cough vaccine during pregnancy. You will then pass protection to your baby before he is born. To continue protecting your baby, he should get whooping cough vaccines starting at 2 months old. You may never have gotten the Tdap vaccine before and did not get it during this pregnancy. If so, you should make sure to get the vaccine immediately after you give birth, before leaving the hospital or birthing center. It will take about 2 weeks before your body develops protection (antibodies) in response to the vaccine. Once you have protection from the vaccine, you are less likely to give whooping cough to your newborn while caring for him. But remember, your baby will still be at risk for catching whooping cough from others. A recent study looked to see how effective Tdap was at preventing whooping cough in babies whose mothers got the vaccine while pregnant or in the hospital after giving birth. The study found that getting Tdap between 27 through 36 weeks of pregnancy is 85% more effective at preventing whooping cough in babies younger than 2 months old. Blood tests cannot tell if you need a whooping cough vaccine There are no blood tests that can tell you if you have enough antibodies in your body to protect yourself or your baby against whooping cough. Even if you have been sick with whooping cough in the past or previously received the vaccine, you still should get the vaccine during each pregnancy. Breastfeeding may pass some protective antibodies onto your baby By breastfeeding, you may pass some antibodies you have made in response to the vaccine to your baby. When you get a whooping cough vaccine during your pregnancy, you will have antibodies in your breast milk that you can share with your baby as soon as your milk comes in. However, your baby will not get  protective antibodies immediately if you wait to get the whooping cough vaccine until after delivering your baby. This is because it takes about 2 weeks for your body to create antibodies. Learn more about the health benefits of breastfeeding.  

## 2021-09-12 DIAGNOSIS — O24419 Gestational diabetes mellitus in pregnancy, unspecified control: Secondary | ICD-10-CM | POA: Insufficient documentation

## 2021-09-12 LAB — RPR, QUANT+TP ABS (REFLEX)
Rapid Plasma Reagin, Quant: 1:1 {titer} — ABNORMAL HIGH
T Pallidum Abs: NONREACTIVE

## 2021-09-12 LAB — GLUCOSE TOLERANCE, 2 HOURS W/ 1HR
Glucose, 1 hour: 160 mg/dL (ref 70–179)
Glucose, 2 hour: 101 mg/dL (ref 70–152)
Glucose, Fasting: 92 mg/dL — ABNORMAL HIGH (ref 70–91)

## 2021-09-12 LAB — CBC
Hematocrit: 34.8 % (ref 34.0–46.6)
Hemoglobin: 11.6 g/dL (ref 11.1–15.9)
MCH: 26.9 pg (ref 26.6–33.0)
MCHC: 33.3 g/dL (ref 31.5–35.7)
MCV: 81 fL (ref 79–97)
Platelets: 256 10*3/uL (ref 150–450)
RBC: 4.32 x10E6/uL (ref 3.77–5.28)
RDW: 14 % (ref 11.7–15.4)
WBC: 7.8 10*3/uL (ref 3.4–10.8)

## 2021-09-12 LAB — RPR: RPR Ser Ql: REACTIVE — AB

## 2021-09-12 LAB — HIV ANTIBODY (ROUTINE TESTING W REFLEX): HIV Screen 4th Generation wRfx: NONREACTIVE

## 2021-09-12 NOTE — Addendum Note (Signed)
Addended by: Jaynie Collins A on: 09/12/2021 08:38 AM   Modules accepted: Orders

## 2021-09-16 ENCOUNTER — Other Ambulatory Visit: Payer: Self-pay | Admitting: Obstetrics & Gynecology

## 2021-09-16 DIAGNOSIS — E0500 Thyrotoxicosis with diffuse goiter without thyrotoxic crisis or storm: Secondary | ICD-10-CM

## 2021-09-16 DIAGNOSIS — E0590 Thyrotoxicosis, unspecified without thyrotoxic crisis or storm: Secondary | ICD-10-CM

## 2021-09-18 ENCOUNTER — Ambulatory Visit (INDEPENDENT_AMBULATORY_CARE_PROVIDER_SITE_OTHER): Payer: Medicaid Other | Admitting: Registered"

## 2021-09-18 ENCOUNTER — Other Ambulatory Visit: Payer: Self-pay | Admitting: *Deleted

## 2021-09-18 ENCOUNTER — Encounter: Payer: Medicaid Other | Attending: Obstetrics & Gynecology | Admitting: Registered"

## 2021-09-18 ENCOUNTER — Other Ambulatory Visit: Payer: Self-pay

## 2021-09-18 DIAGNOSIS — Z713 Dietary counseling and surveillance: Secondary | ICD-10-CM | POA: Insufficient documentation

## 2021-09-18 DIAGNOSIS — O24419 Gestational diabetes mellitus in pregnancy, unspecified control: Secondary | ICD-10-CM | POA: Diagnosis not present

## 2021-09-18 MED ORDER — ACCU-CHEK GUIDE VI STRP: ORAL_STRIP | 12 refills | Status: DC

## 2021-09-18 MED ORDER — ACCU-CHEK SOFTCLIX LANCETS MISC: 1.0000 | Freq: Four times a day (QID) | 12 refills | Status: DC

## 2021-09-18 MED ORDER — ACCU-CHEK GUIDE W/DEVICE KIT: 1.0000 | PACK | Freq: Four times a day (QID) | 0 refills | Status: DC

## 2021-09-18 NOTE — Progress Notes (Signed)
Patient was seen for Gestational Diabetes self-management on 09/18/21  Start time 1020 and End time 1110   Estimated due date: 12/01/21; [redacted]w[redacted]d  Clinical: Medications: reviewed Medical History: graves disease, GDM Labs: OGTT FBS 92, A1c 5.2%   Dietary and Lifestyle History: Pt states she has read some books on nutrition but reading conflicting advice. Has tried Keto and Vegetarian diets.   Pt states she would like to return for follow-up visit after she has some BG data.  Food allergies: eggs Food intolerance: dairy  Physical Activity: not assessed Stress: not assessed Sleep: not assessed  24 hr Recall: not assessed     NUTRITION INTERVENTION  Nutrition education (E-1) on the following topics:   Initial Follow-up  [x]  []  Definition of Gestational Diabetes []  []  Why dietary management is important in controlling blood glucose [x]  []  Effects each nutrient has on blood glucose levels [x]  []  Simple carbohydrates vs complex carbohydrates []  []  Fluid intake [x]  []  Creating a balanced meal plan [x]  []  Carbohydrate counting  [x]  []  When to check blood glucose levels [x]  []  Proper blood glucose monitoring techniques [x]  []  Effect of stress and stress reduction techniques  [x]  []  Exercise effect on blood glucose levels, appropriate exercise during pregnancy []  []  Importance of limiting caffeine and abstaining from alcohol and smoking []  []  Medications used for blood sugar control during pregnancy []  []  Hypoglycemia and rule of 15 [x]  []  Postpartum self care  Blood glucose monitor given: none Rx sent in for Accu chek & used demo meter for instruction  Patient instructed to monitor glucose levels: FBS: 60 - ? 95 mg/dL (some clinics use 90 for cutoff) 1 hour: ? 140 mg/dL 2 hour: ? mg/dL  Patient received handouts: Nutrition Diabetes and Pregnancy Carbohydrate Counting List  Patient will be seen for follow-up in 2 weeks or as needed.

## 2021-09-21 ENCOUNTER — Ambulatory Visit: Payer: Medicaid Other | Admitting: *Deleted

## 2021-09-21 ENCOUNTER — Ambulatory Visit: Payer: Medicaid Other | Attending: Obstetrics and Gynecology

## 2021-09-21 VITALS — BP 135/68 | HR 84

## 2021-09-21 DIAGNOSIS — O99282 Endocrine, nutritional and metabolic diseases complicating pregnancy, second trimester: Secondary | ICD-10-CM | POA: Insufficient documentation

## 2021-09-21 DIAGNOSIS — E0590 Thyrotoxicosis, unspecified without thyrotoxic crisis or storm: Secondary | ICD-10-CM | POA: Insufficient documentation

## 2021-09-21 DIAGNOSIS — Z98891 History of uterine scar from previous surgery: Secondary | ICD-10-CM | POA: Insufficient documentation

## 2021-09-21 DIAGNOSIS — O99212 Obesity complicating pregnancy, second trimester: Secondary | ICD-10-CM | POA: Diagnosis present

## 2021-09-21 DIAGNOSIS — Z3689 Encounter for other specified antenatal screening: Secondary | ICD-10-CM | POA: Diagnosis present

## 2021-09-21 DIAGNOSIS — O0990 Supervision of high risk pregnancy, unspecified, unspecified trimester: Secondary | ICD-10-CM | POA: Insufficient documentation

## 2021-09-25 ENCOUNTER — Encounter: Payer: Medicaid Other | Admitting: Obstetrics & Gynecology

## 2021-09-28 ENCOUNTER — Inpatient Hospital Stay (HOSPITAL_COMMUNITY)
Admission: AD | Admit: 2021-09-28 | Discharge: 2021-09-28 | Disposition: A | Discharge status: Home / Self Care | Payer: Medicaid Other | Attending: Obstetrics and Gynecology | Admitting: Obstetrics and Gynecology

## 2021-09-28 ENCOUNTER — Encounter (HOSPITAL_COMMUNITY): Payer: Self-pay | Admitting: Obstetrics and Gynecology

## 2021-09-28 DIAGNOSIS — Z3A30 30 weeks gestation of pregnancy: Secondary | ICD-10-CM

## 2021-09-28 DIAGNOSIS — O34219 Maternal care for unspecified type scar from previous cesarean delivery: Secondary | ICD-10-CM | POA: Insufficient documentation

## 2021-09-28 DIAGNOSIS — O0993 Supervision of high risk pregnancy, unspecified, third trimester: Secondary | ICD-10-CM | POA: Insufficient documentation

## 2021-09-28 DIAGNOSIS — Z98891 History of uterine scar from previous surgery: Secondary | ICD-10-CM

## 2021-09-28 DIAGNOSIS — Z0371 Encounter for suspected problem with amniotic cavity and membrane ruled out: Secondary | ICD-10-CM

## 2021-09-28 DIAGNOSIS — O0990 Supervision of high risk pregnancy, unspecified, unspecified trimester: Secondary | ICD-10-CM

## 2021-09-28 LAB — URINALYSIS, ROUTINE W REFLEX MICROSCOPIC
Bilirubin Urine: NEGATIVE
Glucose, UA: NEGATIVE mg/dL
Hgb urine dipstick: NEGATIVE
Ketones, ur: NEGATIVE mg/dL
Leukocytes,Ua: NEGATIVE
Nitrite: NEGATIVE
Protein, ur: NEGATIVE mg/dL
Specific Gravity, Urine: 1.017 (ref 1.005–1.030)
pH: 6 (ref 5.0–8.0)

## 2021-09-28 LAB — POCT FERN TEST: POCT Fern Test: NEGATIVE

## 2021-09-28 LAB — AMNISURE RUPTURE OF MEMBRANE (ROM) NOT AT ARMC: Amnisure ROM: NEGATIVE

## 2021-09-28 NOTE — MAU Note (Signed)
.  Rachel Mendez is a 36 y.o. at [redacted]w[redacted]d here in MAU reporting: she started leaking at 10am. Has been having seeing spots on and off the whole pregnancy (did not tell her doctor thought it was normal). Also c/o increased pelvic pressure and cramping. Fetal movement a little less than normal.  LMP:  Onset of complaint: 10am Pain score: 5-7 Vitals:   09/28/21 1540  BP: 135/70  Pulse: 100  Resp: 18  Temp: 98.4 F (36.9 C)     FHT:131 Lab orders placed from triage:  u/a

## 2021-09-28 NOTE — MAU Provider Note (Signed)
Chief Complaint:  Rupture of Membranes   Event Date/Time   First Provider Initiated Contact with Patient 09/28/21 1733      HPI: Rachel Mendez is a 36 y.o. G2P1001 at 32w6dby early ultrasound who presents to maternity admissions reporting leaking fluid since 10 am, enough to leave a wet spot on a towel but not requiring a pad.  She reports some decreased fetal movement last night and this morning but is feeling normal movement in MAU. She denies any itching/burning/odor with the discharge.  HPI  Past Medical History: Past Medical History:  Diagnosis Date   Anxiety    Asthma    Depression    Graves disease 07/21/2021    Past obstetric history: OB History  Gravida Para Term Preterm AB Living  _0 SAB IAB Ectopic Multiple Live Births          1    # Outcome Date GA Lbr Len/2nd Weight Sex Delivery Anes PTL Lv  2 Current           1 Term 01/23/18 [id number] g M CS-LTranv  N LIV    Past Surgical History: Past Surgical History:  Procedure Laterality Date   CESAREAN SECTION      Family History: Family History  Problem Relation Age of Onset   Anxiety disorder Mother    Depression Mother    Hashimoto's thyroiditis Mother    ADD / ADHD Sister    Epilepsy Sister    Epilepsy Maternal Grandmother    Diabetes Paternal Grandfather        Type 1    Social History: Social History   Tobacco Use   Smoking status: Never   Smokeless tobacco: Never  Vaping Use   Vaping Use: Former   Quit date: 04/04/2021   Substances: Nicotine  Substance Use Topics   Alcohol use: Not Currently   Drug use: Not Currently    Allergies:  Allergies  Allergen Reactions   Bee Venom Anaphylaxis   Latex Hives    Meds:  No medications prior to admission.    ROS:  Review of Systems  Constitutional:  Negative for chills, fatigue and fever.  Eyes:  Negative for visual disturbance.  Respiratory:  Negative for shortness of breath.   Cardiovascular:  Negative for chest pain.   Gastrointestinal:  Negative for abdominal pain, nausea and vomiting.  Genitourinary:  Positive for vaginal discharge. Negative for difficulty urinating, dysuria, flank pain, pelvic pain, vaginal bleeding and vaginal pain.  Neurological:  Negative for dizziness and headaches.  Psychiatric/Behavioral: Negative.       I have reviewed patient's Past Medical Hx, Surgical Hx, Family Hx, Social Hx, medications and allergies.   Physical Exam  Patient Vitals for the past 24 hrs:  BP Temp Pulse Resp Height Weight  09/28/21 1853 127/77 -- (!) 103 -- -- --  09/28/21 1540 135/70 98.4 F (36.9 C) 100 18 5' 5.5" (1.664 m) 129.3 kg   Constitutional: Well-developed, well-nourished female in no acute distress.  Cardiovascular: normal rate Respiratory: normal effort GI: Abd soft, non-tender, gravid appropriate for gestational age.  MS: Extremities nontender, no edema, normal ROM Neurologic: Alert and oriented x 4.  GU: Neg CVAT.  PELVIC EXAM: Cervix pink, visually closed, without lesion, scant white creamy discharge, negative pooling of fluid with Valsalva, vaginal walls and external genitalia normal   Dilation: Closed (visually) Exam by:: LiFatima BlankCNM  FHT:  Baseline 140 , moderate variability, accelerations  present, no decelerations Contractions: None on toco or to palpation   Labs: Results for orders placed or performed during the hospital encounter of 09/28/21 (from the past 24 hour(s))  Urinalysis, Routine w reflex microscopic Urine, Clean Catch     Status: None   Collection Time: 09/28/21  4:13 PM  Result Value Ref Range   Color, Urine YELLOW YELLOW   APPearance CLEAR CLEAR   Specific Gravity, Urine 1.017 1.005 - 1.030   pH 6.0 5.0 - 8.0   Glucose, UA NEGATIVE NEGATIVE mg/dL   Hgb urine dipstick NEGATIVE NEGATIVE   Bilirubin Urine NEGATIVE NEGATIVE   Ketones, ur NEGATIVE NEGATIVE mg/dL   Protein, ur NEGATIVE NEGATIVE mg/dL   Nitrite NEGATIVE NEGATIVE   Leukocytes,Ua  NEGATIVE NEGATIVE  Amnisure rupture of membrane (rom)not at [facility]     Status: None   Collection Time: 09/28/21  5:30 PM  Result Value Ref Range   Amnisure ROM NEGATIVE   Fern Test     Status: None   Collection Time: 09/28/21  5:47 PM  Result Value Ref Range   POCT Fern Test Negative = intact amniotic membranes    O/Positive/-- (03/15 0949)  Imaging:   MAU Course/MDM: Orders Placed This Encounter  Procedures   Urinalysis, Routine w reflex microscopic Urine, Clean Catch   Amnisure rupture of membrane (rom)not at [facility] Test   Discharge patient    No orders of the defined types were placed in this encounter.    NST reviewed and appropriate for gestational age Ferning, pooling, and amnisure negative so no evidence of ROM D/C home with preterm labor precautions Keep scheduled appts with [facility]    Assessment: 1. Supervision of high risk pregnancy, antepartum   2. Previous cesarean section   3. Encounter for suspected premature rupture of amniotic membranes, with rupture of membranes not found   4. [redacted] weeks gestation of pregnancy     Plan: Discharge home Labor precautions and fetal kick counts  Follow-up [place] for [place] at [facility] Follow up.   Specialty: Obstetrics and Gynecology Why: As scheduled Contact information: [place] [facility] Follow up.   Specialty: Obstetrics and Gynecology Why: As needed for emergencies Contact information: [address] [id number] [place] [phone]               Allergies as of 09/28/2021       Reactions   Bee Venom Anaphylaxis   Latex Hives        Medication List     STOP taking these medications    azithromycin 250 MG tablet Commonly known as: ZITHROMAX   metoCLOPramide 10 MG tablet Commonly known as: REGLAN       TAKE these  medications    Accu-Chek Guide test strip Generic drug: glucose blood Use to check blood sugars four times a day was instructed   Accu-Chek Guide w/Device Kit 1 Device by Does not apply route 4 (four) times daily.   Accu-Chek Softclix Lancets lancets 1 each by Other route 4 (four) times daily.   pantoprazole 40 MG tablet Commonly known as: Protonix Take 1 tablet (40 mg total) by mouth daily.   prenatal multivitamin Tabs tablet Take 1 tablet by mouth daily at 12 noon.   propylthiouracil 50 MG tablet Commonly known as: PTU TAKE 1 TABLET BY MOUTH  TWICE A DAY   sertraline 50 MG tablet Commonly known as: ZOLOFT Take 50 mg by mouth daily.   Ventolin HFA 108 (90 Base) MCG/ACT inhaler Generic drug: albuterol SMARTSIG:1 Puff(s) Via Inhaler Every 6 Hours PRN   albuterol 0.63 MG/3ML nebulizer solution Commonly known as: ACCUNEB SMARTSIG:Via Nebulizer 2-3 Times Daily PRN        Fatima Blank Certified Nurse-Midwife 09/28/2021 9:00 PM

## 2021-09-28 NOTE — Discharge Instructions (Signed)
Reasons to return to MAU at [facility]:  Since you are preterm, return to MAU if:  1.  Contractions are 10 minutes apart or less and they becoming more uncomfortable or painful over time 2.  You have a large gush of fluid, or a trickle of fluid that will not stop and you have to wear a pad 3.  You have bleeding that is bright red, heavier than spotting--like menstrual bleeding (spotting can be normal in early labor or after a check of your cervix) 4.  You do not feel the baby moving like he/she normally does  

## 2021-10-01 ENCOUNTER — Other Ambulatory Visit: Payer: Self-pay | Admitting: Obstetrics & Gynecology

## 2021-10-01 DIAGNOSIS — E0500 Thyrotoxicosis with diffuse goiter without thyrotoxic crisis or storm: Secondary | ICD-10-CM

## 2021-10-01 DIAGNOSIS — E0590 Thyrotoxicosis, unspecified without thyrotoxic crisis or storm: Secondary | ICD-10-CM

## 2021-10-04 ENCOUNTER — Ambulatory Visit: Payer: Medicaid Other

## 2021-10-09 ENCOUNTER — Ambulatory Visit (INDEPENDENT_AMBULATORY_CARE_PROVIDER_SITE_OTHER): Payer: Medicaid Other | Admitting: Obstetrics & Gynecology

## 2021-10-09 ENCOUNTER — Other Ambulatory Visit: Payer: Medicaid Other

## 2021-10-09 VITALS — BP 136/82 | HR 103 | Wt 285.0 lb

## 2021-10-09 DIAGNOSIS — O99280 Endocrine, nutritional and metabolic diseases complicating pregnancy, unspecified trimester: Secondary | ICD-10-CM

## 2021-10-09 DIAGNOSIS — Z3A32 32 weeks gestation of pregnancy: Secondary | ICD-10-CM

## 2021-10-09 DIAGNOSIS — Z23 Encounter for immunization: Secondary | ICD-10-CM | POA: Diagnosis not present

## 2021-10-09 DIAGNOSIS — O0990 Supervision of high risk pregnancy, unspecified, unspecified trimester: Secondary | ICD-10-CM

## 2021-10-09 DIAGNOSIS — Z98891 History of uterine scar from previous surgery: Secondary | ICD-10-CM

## 2021-10-09 DIAGNOSIS — O99213 Obesity complicating pregnancy, third trimester: Secondary | ICD-10-CM

## 2021-10-09 DIAGNOSIS — E0500 Thyrotoxicosis with diffuse goiter without thyrotoxic crisis or storm: Secondary | ICD-10-CM

## 2021-10-09 DIAGNOSIS — O99210 Obesity complicating pregnancy, unspecified trimester: Secondary | ICD-10-CM

## 2021-10-09 DIAGNOSIS — O99283 Endocrine, nutritional and metabolic diseases complicating pregnancy, third trimester: Secondary | ICD-10-CM

## 2021-10-09 DIAGNOSIS — E0590 Thyrotoxicosis, unspecified without thyrotoxic crisis or storm: Secondary | ICD-10-CM

## 2021-10-09 DIAGNOSIS — O24410 Gestational diabetes mellitus in pregnancy, diet controlled: Secondary | ICD-10-CM

## 2021-10-09 DIAGNOSIS — O0993 Supervision of high risk pregnancy, unspecified, third trimester: Secondary | ICD-10-CM

## 2021-10-09 NOTE — Progress Notes (Signed)
PRENATAL VISIT NOTE  Subjective:  Rachel Mendez is a 36 y.o. G2P1001 at [redacted]w[redacted]d being seen today for ongoing prenatal care.  She is currently monitored for the following issues for this high-risk pregnancy and has Supervision of high risk pregnancy, antepartum; Previous cesarean section; Maternal morbid obesity, antepartum ([place]); Mixed incontinence; Anxiety; Depression; ADD (attention deficit disorder); Asthma; Low thyroid stimulating hormone (TSH) level; Hyperthyroidism in pregnancy, antepartum; Graves disease; Request for sterilization; and Gestational diabetes mellitus (GDM), antepartum on their problem list.  Patient reports no complaints.  Contractions: Irritability. Vag. Bleeding: None.  Movement: Present. Denies leaking of fluid.   The following portions of the patient's history were reviewed and updated as appropriate: allergies, current medications, past family history, past medical history, past social history, past surgical history and problem list.   Objective:   Vitals:   10/09/21 0831  BP: 136/82  Pulse: (!) 103  Weight: 285 lb (129.3 kg)    Fetal Status: Fetal Heart Rate (bpm): 148   Movement: Present     General:  Alert, oriented and cooperative. Patient is in no acute distress.  Skin: Skin is warm and dry. No rash noted.   Cardiovascular: Normal heart rate noted  Respiratory: Normal respiratory effort, no problems with respiration noted  Abdomen: Soft, gravid, appropriate for gestational age.  Pain/Pressure: Present     Pelvic: Cervical exam deferred        Extremities: Normal range of motion.  Edema: Trace  Mental Status: Normal mood and affect. Normal behavior. Normal judgment and thought content.   [place] MFM OB FOLLOW UP  Result Date: 09/21/2021 ----------------------------------------------------------------------  OBSTETRICS REPORT                       (Signed Final 09/21/2021 11:57 am) ---------------------------------------------------------------------- Patient  Info  ID #:       [id number]                          D.O.B.:  [birth year] (36 yrs)  Name:       Rachel Mendez                   Visit Date: 09/21/2021 10:47 am ---------------------------------------------------------------------- Performed By  Attending:        Johnell Comings MD         Ref. Address:     [place]  Performed By:     Germain Osgood            [facility]  Referred By:      [facility] ---------------------------------------------------------------------- Orders  #  Description                           Code        Ordered By  1  [place] MFM OB FOLLOW UP                   [phone]    Noralee Space ----------------------------------------------------------------------  #  Order #                     Accession #                Episode #  1  [id number]                   [id number]                 [id number] ---------------------------------------------------------------------- Indications  Obesity complicating pregnancy, third          O99.213  trimester (BMI 45)  Gestational diabetes in pregnancy, diet        O24.410  controlled  Abnormal finding on antenatal screening        O28.9  (low fetal fraction NIPS)  Hyperthyroid                                   O99.280 E05.90  Previous cesarean delivery, antepartum         O34.219  Neg [place] 4  [redacted] weeks gestation of pregnancy                Z3A.29 ---------------------------------------------------------------------- Fetal Evaluation  Num Of Fetuses:         1  Fetal Heart Rate(bpm):  141  Cardiac Activity:       Observed  Presentation:           Breech  Placenta:               Anterior  P. Cord Insertion:      Visualized, central  Amniotic Fluid  AFI FV:       Within normal limits  AFI Sum(cm)     %Tile       Largest Pocket(cm)  21.2            84          8.37  RUQ(cm)       RLQ(cm)       LUQ(cm)        LLQ(cm)  8.37          1.75          4.84           6.24 ---------------------------------------------------------------------- Biometry  BPD:     76.78  mm     G. Age:  30w 6d         68  %    CI:        72.32   %  70 - 86                                                          FL/HC:      19.5   %    19.2 - 21.4  HC:    287.23   mm     G. Age:  31w 4d         66  %    HC/AC:      1.09        0.99 - 1.21  AC:    264.54   mm     G. Age:  30w 4d         67  %    FL/BPD:     73.0   %    71 - 87  FL:      56.06  mm     G. Age:  29w 3d         25  %    FL/AC:      21.2   %    20 - 24  Est. FW:    1556  gm      3 lb 7 oz     55  % ---------------------------------------------------------------------- OB History  Gravidity:    2         Term:   1        Prem:   0        SAB:   0  TOP:          0       Ectopic:  0        Living: 1 ---------------------------------------------------------------------- Gestational Age  LMP:           34w 1d        Date:  01/25/21                 EDD:   11/01/21  U/S Today:     30w 4d                                        EDD:   11/26/21  Best:          29w 6d     Det. By:  U/S C R L  (04/11/21)    EDD:   12/01/21 ---------------------------------------------------------------------- Anatomy  Cranium:               Appears normal         Aortic Arch:            Not well visualized  Cavum:                 Appears normal         Ductal Arch:            Previously seen  Ventricles:            Appears normal         Diaphragm:              Appears normal  Choroid Plexus:        Previously seen        Stomach:  Appears normal, left                                                                        sided  Cerebellum:            Previously seen        Abdomen:                Previously seen  Posterior Fossa:       Previously seen         Abdominal Wall:         Previously seen  Nuchal Fold:           Not applicable (>20    Cord Vessels:           Previously seen                         wks GA)  Face:                  Orbits prev vis,       Kidneys:                Appear normal                         Profile not well vis  Lips:                  Previously seen        Bladder:                Appears normal  Thoracic:              Appears normal         Spine:                  Previously seen  Heart:                 Previously seen        Upper Extremities:      Previously seen  RVOT:                  Previously seen        Lower Extremities:      Previously seen  LVOT:                  Previously seen  Other:  Nasal bone, lenses and heels/feet previously visualized. Fetus prev          appears to be female. Technically difficult due to maternal habitus. ---------------------------------------------------------------------- Cervix Uterus Adnexa  Cervix  Not visualized (advanced GA >24wks)  Uterus  No abnormality visualized.  Right Ovary  Not visualized.  Left Ovary  Not visualized.  Cul De Sac  No free fluid seen.  Adnexa  No adnexal mass visualized. ---------------------------------------------------------------------- Comments  This patient was seen for a follow up growth scan due to  maternal obesity and diet-controlled gestational diabetes.  She denies any problems since her last exam.  She was informed that the fetal growth and amniotic fluid  level appears appropriate for her gestational age.  She will return in 2 weeks for a  BPP. ----------------------------------------------------------------------                   Johnell Comings, MD Electronically Signed Final Report   09/21/2021 11:57 am ----------------------------------------------------------------------   Assessment and Plan:  Pregnancy: G2P1001 at [redacted]w[redacted]d 1. Hyperthyroidism in pregnancy, antepartum On PTU 50 mg bid. Will check labs.  PCP is no longer practicing and has been unable to  see Endocrinologist.  Will check labs and adjust regimen accordignly. Growth scan and BPP this week, starting weekly antenatal testing. Will get subsequent BPPs here in office.   - TSH - T3, free - T4, free - Thyroid stimulating immunoglobulin - Thyroid peroxidase antibody  2. Diet controlled gestational diabetes mellitus (GDM), antepartum Did not bring log but reports normal ranges. Advised to bring at next visit. A1C added to today's labs.  - Hemoglobin A1c  3. Maternal morbid obesity, antepartum (HCC) TWG 7 lb, doing well.  4. Previous cesarean section Desires TOLAC, papers signed 09/11/21  5. Need for Tdap vaccination - Tdap vaccine greater than or equal to 7yo IM given today  6. [redacted] weeks gestation of pregnancy 7. Supervision of high risk pregnancy, antepartum Preterm labor symptoms and general obstetric precautions including but not limited to vaginal bleeding, contractions, leaking of fluid and fetal movement were reviewed in detail with the patient. Please refer to After Visit Summary for other counseling recommendations.   Return in about 1 week (around 10/16/2021) for BPP, NST with Gerald Stabs in office; will get weekly BPP and NST with Gerald Stabs for rest of pregnancy.  Future Appointments  Date Time Provider [place]  10/09/2021  2:15 PM [facility]  10/11/2021 10:30 AM [facility] NURSE [facility]  10/11/2021 10:45 AM [facility] US5 [facility]  10/23/2021  8:35 AM Aletha Halim, MD [facility]  11/06/2021  8:35 AM Jaramie Bastos, Sallyanne Havers, MD [facility]  11/13/2021  8:35 AM Harolyn Rutherford, Sallyanne Havers, MD [facility]  11/20/2021  8:35 AM Elly Modena, Vickii Chafe, MD [facility]  11/27/2021  8:35 AM Radene Gunning, MD [facility]    Verita Schneiders, MD

## 2021-10-09 NOTE — Patient Instructions (Signed)
Return to office for any scheduled appointments. Call the office or go to the MA[facility] at [facility] [place] if: You begin to have strong, frequent contractions Your water breaks.  Sometimes it is a big gush of fluid, sometimes it is just a trickle that keeps getting your underwear wet or running down your legs You have vaginal bleeding.  It is normal to have a small amount of spotting if your cervix was checked.  You do not feel your baby moving like normal.  If you do not, get something to eat and drink and lay down and focus on feeling your baby move.   If your baby is still not moving like normal, you should call the office or go to MAU. Any other obstetric concerns.  

## 2021-10-10 LAB — THYROID STIMULATING IMMUNOGLOBULIN: Thyroid Stim Immunoglobulin: <0.1 IU/L (ref 0.00–0.55)

## 2021-10-10 LAB — T3, FREE: T3, Free: 5.2 pg/mL — ABNORMAL HIGH (ref 2.0–4.4)

## 2021-10-10 LAB — TSH: TSH: <0.005 u[IU]/mL — ABNORMAL LOW (ref 0.450–4.500)

## 2021-10-10 LAB — HEMOGLOBIN A1C
Est. average glucose Bld gHb Est-mCnc: 105 mg/dL
Hgb A1c MFr Bld: 5.3 % (ref 4.8–5.6)

## 2021-10-10 LAB — T4, FREE: Free T4: 1.65 ng/dL (ref 0.82–1.77)

## 2021-10-10 LAB — THYROID PEROXIDASE ANTIBODY: Thyroperoxidase Ab SerPl-aCnc: 453 IU/mL — ABNORMAL HIGH (ref 0–34)

## 2021-10-11 ENCOUNTER — Other Ambulatory Visit: Payer: Self-pay | Admitting: *Deleted

## 2021-10-11 ENCOUNTER — Ambulatory Visit: Payer: Medicaid Other | Attending: Obstetrics and Gynecology

## 2021-10-11 ENCOUNTER — Ambulatory Visit: Payer: Medicaid Other | Admitting: *Deleted

## 2021-10-11 VITALS — BP 126/62 | HR 89

## 2021-10-11 DIAGNOSIS — O280 Abnormal hematological finding on antenatal screening of mother: Secondary | ICD-10-CM | POA: Diagnosis not present

## 2021-10-11 DIAGNOSIS — Z3689 Encounter for other specified antenatal screening: Secondary | ICD-10-CM | POA: Diagnosis present

## 2021-10-11 DIAGNOSIS — O34219 Maternal care for unspecified type scar from previous cesarean delivery: Secondary | ICD-10-CM

## 2021-10-11 DIAGNOSIS — O99212 Obesity complicating pregnancy, second trimester: Secondary | ICD-10-CM | POA: Insufficient documentation

## 2021-10-11 DIAGNOSIS — E0590 Thyrotoxicosis, unspecified without thyrotoxic crisis or storm: Secondary | ICD-10-CM | POA: Insufficient documentation

## 2021-10-11 DIAGNOSIS — E669 Obesity, unspecified: Secondary | ICD-10-CM

## 2021-10-11 DIAGNOSIS — O24410 Gestational diabetes mellitus in pregnancy, diet controlled: Secondary | ICD-10-CM | POA: Diagnosis not present

## 2021-10-11 DIAGNOSIS — O403XX0 Polyhydramnios, third trimester, not applicable or unspecified: Secondary | ICD-10-CM | POA: Diagnosis not present

## 2021-10-11 DIAGNOSIS — Z98891 History of uterine scar from previous surgery: Secondary | ICD-10-CM | POA: Insufficient documentation

## 2021-10-11 DIAGNOSIS — O0990 Supervision of high risk pregnancy, unspecified, unspecified trimester: Secondary | ICD-10-CM | POA: Diagnosis present

## 2021-10-11 DIAGNOSIS — Z3A32 32 weeks gestation of pregnancy: Secondary | ICD-10-CM

## 2021-10-11 DIAGNOSIS — O99283 Endocrine, nutritional and metabolic diseases complicating pregnancy, third trimester: Secondary | ICD-10-CM | POA: Diagnosis not present

## 2021-10-11 DIAGNOSIS — O99282 Endocrine, nutritional and metabolic diseases complicating pregnancy, second trimester: Secondary | ICD-10-CM | POA: Insufficient documentation

## 2021-10-11 DIAGNOSIS — O24419 Gestational diabetes mellitus in pregnancy, unspecified control: Secondary | ICD-10-CM

## 2021-10-11 DIAGNOSIS — Z6841 Body Mass Index (BMI) 40.0 and over, adult: Secondary | ICD-10-CM

## 2021-10-11 DIAGNOSIS — O99213 Obesity complicating pregnancy, third trimester: Secondary | ICD-10-CM

## 2021-10-12 MED ORDER — PROPYLTHIOURACIL 50 MG PO TABS: 100.0000 mg | ORAL_TABLET | Freq: Two times a day (BID) | ORAL | 2 refills | Status: DC

## 2021-10-12 NOTE — Addendum Note (Signed)
Addended by: Jaynie Collins A on: 10/12/2021 10:21 AM   Modules accepted: Orders

## 2021-10-12 NOTE — Progress Notes (Signed)
Will increase PTU to 100 mg bid given elevated thyroid labs. New prescription sent. We will recheck around 37-38 weeks.  Results were released to MyChart and patient was given recommendations as indicated.

## 2021-10-16 ENCOUNTER — Encounter: Payer: Self-pay | Admitting: *Deleted

## 2021-10-16 ENCOUNTER — Other Ambulatory Visit: Payer: Medicaid Other

## 2021-10-16 ENCOUNTER — Ambulatory Visit: Payer: Medicaid Other | Admitting: *Deleted

## 2021-10-16 ENCOUNTER — Ambulatory Visit: Payer: Medicaid Other | Attending: Obstetrics

## 2021-10-16 VITALS — BP 117/60 | HR 83

## 2021-10-16 DIAGNOSIS — O285 Abnormal chromosomal and genetic finding on antenatal screening of mother: Secondary | ICD-10-CM

## 2021-10-16 DIAGNOSIS — Z98891 History of uterine scar from previous surgery: Secondary | ICD-10-CM

## 2021-10-16 DIAGNOSIS — O0990 Supervision of high risk pregnancy, unspecified, unspecified trimester: Secondary | ICD-10-CM | POA: Insufficient documentation

## 2021-10-16 DIAGNOSIS — O403XX0 Polyhydramnios, third trimester, not applicable or unspecified: Secondary | ICD-10-CM

## 2021-10-16 DIAGNOSIS — O24419 Gestational diabetes mellitus in pregnancy, unspecified control: Secondary | ICD-10-CM | POA: Diagnosis present

## 2021-10-16 DIAGNOSIS — O24410 Gestational diabetes mellitus in pregnancy, diet controlled: Secondary | ICD-10-CM | POA: Diagnosis not present

## 2021-10-16 DIAGNOSIS — Z6841 Body Mass Index (BMI) 40.0 and over, adult: Secondary | ICD-10-CM | POA: Insufficient documentation

## 2021-10-16 DIAGNOSIS — E0590 Thyrotoxicosis, unspecified without thyrotoxic crisis or storm: Secondary | ICD-10-CM

## 2021-10-16 DIAGNOSIS — O99282 Endocrine, nutritional and metabolic diseases complicating pregnancy, second trimester: Secondary | ICD-10-CM

## 2021-10-16 DIAGNOSIS — O99213 Obesity complicating pregnancy, third trimester: Secondary | ICD-10-CM

## 2021-10-16 DIAGNOSIS — E669 Obesity, unspecified: Secondary | ICD-10-CM

## 2021-10-16 DIAGNOSIS — O34219 Maternal care for unspecified type scar from previous cesarean delivery: Secondary | ICD-10-CM

## 2021-10-16 DIAGNOSIS — Z3A33 33 weeks gestation of pregnancy: Secondary | ICD-10-CM | POA: Diagnosis not present

## 2021-10-18 ENCOUNTER — Encounter: Payer: Self-pay | Admitting: Obstetrics & Gynecology

## 2021-10-22 ENCOUNTER — Inpatient Hospital Stay (HOSPITAL_COMMUNITY)
Admission: AD | Admit: 2021-10-22 | Discharge: 2021-10-22 | Disposition: A | Discharge status: Home / Self Care | Payer: Medicaid Other | Attending: Family Medicine | Admitting: Family Medicine

## 2021-10-22 ENCOUNTER — Encounter (HOSPITAL_COMMUNITY): Payer: Self-pay | Admitting: Family Medicine

## 2021-10-22 ENCOUNTER — Inpatient Hospital Stay (HOSPITAL_BASED_OUTPATIENT_CLINIC_OR_DEPARTMENT_OTHER): Payer: Medicaid Other

## 2021-10-22 ENCOUNTER — Telehealth: Payer: Self-pay

## 2021-10-22 DIAGNOSIS — O99283 Endocrine, nutritional and metabolic diseases complicating pregnancy, third trimester: Secondary | ICD-10-CM | POA: Insufficient documentation

## 2021-10-22 DIAGNOSIS — O479 False labor, unspecified: Secondary | ICD-10-CM

## 2021-10-22 DIAGNOSIS — O99513 Diseases of the respiratory system complicating pregnancy, third trimester: Secondary | ICD-10-CM | POA: Diagnosis not present

## 2021-10-22 DIAGNOSIS — O403XX0 Polyhydramnios, third trimester, not applicable or unspecified: Secondary | ICD-10-CM

## 2021-10-22 DIAGNOSIS — Z0371 Encounter for suspected problem with amniotic cavity and membrane ruled out: Secondary | ICD-10-CM

## 2021-10-22 DIAGNOSIS — E0590 Thyrotoxicosis, unspecified without thyrotoxic crisis or storm: Secondary | ICD-10-CM

## 2021-10-22 DIAGNOSIS — Z3A34 34 weeks gestation of pregnancy: Secondary | ICD-10-CM

## 2021-10-22 DIAGNOSIS — O99891 Other specified diseases and conditions complicating pregnancy: Secondary | ICD-10-CM | POA: Diagnosis present

## 2021-10-22 DIAGNOSIS — F32A Depression, unspecified: Secondary | ICD-10-CM | POA: Diagnosis not present

## 2021-10-22 DIAGNOSIS — O24410 Gestational diabetes mellitus in pregnancy, diet controlled: Secondary | ICD-10-CM | POA: Diagnosis not present

## 2021-10-22 DIAGNOSIS — O99213 Obesity complicating pregnancy, third trimester: Secondary | ICD-10-CM

## 2021-10-22 DIAGNOSIS — O34219 Maternal care for unspecified type scar from previous cesarean delivery: Secondary | ICD-10-CM

## 2021-10-22 DIAGNOSIS — F419 Anxiety disorder, unspecified: Secondary | ICD-10-CM | POA: Diagnosis not present

## 2021-10-22 DIAGNOSIS — E669 Obesity, unspecified: Secondary | ICD-10-CM

## 2021-10-22 DIAGNOSIS — O99343 Other mental disorders complicating pregnancy, third trimester: Secondary | ICD-10-CM | POA: Insufficient documentation

## 2021-10-22 DIAGNOSIS — R197 Diarrhea, unspecified: Secondary | ICD-10-CM | POA: Diagnosis not present

## 2021-10-22 DIAGNOSIS — O285 Abnormal chromosomal and genetic finding on antenatal screening of mother: Secondary | ICD-10-CM

## 2021-10-22 LAB — URINALYSIS, ROUTINE W REFLEX MICROSCOPIC
Bilirubin Urine: NEGATIVE
Glucose, UA: NEGATIVE mg/dL
Hgb urine dipstick: NEGATIVE
Ketones, ur: 5 mg/dL — AB
Nitrite: NEGATIVE
Protein, ur: NEGATIVE mg/dL
Specific Gravity, Urine: 1.006 (ref 1.005–1.030)
pH: 7 (ref 5.0–8.0)

## 2021-10-22 LAB — POCT FERN TEST: POCT Fern Test: NEGATIVE

## 2021-10-22 NOTE — MAU Provider Note (Signed)
History     [id number]  Arrival date and time: 10/22/21 1528    Chief Complaint  Patient presents with   Contractions   Rupture of Membranes     HPI Rachel Mendez is a 36 y.o. at [redacted]w[redacted]d who presents for contractions & vaginal discharge. Reports painful contractions every 3 minutes since this morning. Also noticed some thin watery discharge this morning & was unsure if her water broke. Had one episode of diarrhea this morning. Denies n/v, dysuria, vaginal bleeding, vaginal irritation, or recent intercourse. Reports good fetal movement.    OB History     Gravida  2   Para  1   Term  1   Preterm      AB      Living  1      SAB      IAB      Ectopic      Multiple      Live Births  1           Past Medical History:  Diagnosis Date   Anxiety    Asthma    Depression    Graves disease 07/21/2021    Past Surgical History:  Procedure Laterality Date   CESAREAN SECTION      Family History  Problem Relation Age of Onset   Anxiety disorder Mother    Depression Mother    Hashimoto's thyroiditis Mother    ADD / ADHD Sister    Epilepsy Sister    Epilepsy Maternal Grandmother    Diabetes Paternal Grandfather        Type 1    Allergies  Allergen Reactions   Bee Venom Anaphylaxis   Latex Hives    No current facility-administered medications on file prior to encounter.   Current Outpatient Medications on File Prior to Encounter  Medication Sig Dispense Refill   pantoprazole (PROTONIX) 40 MG tablet Take 1 tablet (40 mg total) by mouth daily. 90 tablet 2   propylthiouracil (PTU) 50 MG tablet Take 2 tablets (100 mg total) by mouth 2 (two) times daily. 120 tablet 2   sertraline (ZOLOFT) 50 MG tablet Take 50 mg by mouth daily.     Accu-Chek Softclix Lancets lancets 1 each by Other route 4 (four) times daily. 100 each 12   albuterol (ACCUNEB) 0.63 MG/3ML nebulizer solution SMARTSIG:Via Nebulizer 2-3 Times Daily PRN     Blood Glucose Monitoring Suppl  (ACCU-CHEK GUIDE) w/Device KIT 1 Device by Does not apply route 4 (four) times daily. 1 kit 0   glucose blood (ACCU-CHEK GUIDE) test strip Use to check blood sugars four times a day was instructed 50 each 12   Prenatal Vit-Fe Fumarate-FA (PRENATAL MULTIVITAMIN) TABS tablet Take 1 tablet by mouth daily at 12 noon.     VENTOLIN HFA 108 (90 Base) MCG/ACT inhaler SMARTSIG:1 Puff(s) Via Inhaler Every 6 Hours PRN       ROS Pertinent positives and negative per HPI, all others reviewed and negative  Physical Exam   BP 124/67   Pulse 100   Temp (!) 97.4 F (36.3 C) (Oral)   Resp 20   LMP 01/25/2021   SpO2 99%   Patient Vitals for the past 24 hrs:  BP Temp Temp src Pulse Resp SpO2  10/22/21 1744 124/67 -- -- 100 20 99 %  10/22/21 1544 (!) 128/54 (!) 97.4 F (36.3 C) Oral (!) 104 (!) 24 98 %    Physical Exam Vitals and nursing note reviewed. Exam conducted with  a chaperone present.  Constitutional:      General: She is not in acute distress.    Appearance: Normal appearance.  HENT:     Head: Normocephalic and atraumatic.  Eyes:     Conjunctiva/sclera: Conjunctivae normal.     Pupils: Pupils are equal, round, and reactive to light.  Pulmonary:     Effort: Pulmonary effort is normal. No respiratory distress.  Abdominal:     Palpations: Abdomen is soft.     Tenderness: There is no abdominal tenderness.  Genitourinary:    Comments: Pelvic: small amount of thick white discharge, no pooling of fluid. No blood.  Skin:    General: Skin is warm and dry.  Neurological:     Mental Status: She is alert.  Psychiatric:        Mood and Affect: Mood normal.        Behavior: Behavior normal.     Cervical Exam Dilation: Closed Effacement (%): Thick Cervical Position: Posterior Station: -3 Exam by:: Jorje Guild NP  FHT Baseline 135, moderate variability, 10x10 accels, no decels Toco: UI Cat: 1  Labs Results for orders placed or performed during the hospital encounter of 10/22/21  (from the past 24 hour(s))  Urinalysis, Routine w reflex microscopic Urine, Clean Catch     Status: Abnormal   Collection Time: 10/22/21  3:25 PM  Result Value Ref Range   Color, Urine YELLOW YELLOW   APPearance HAZY (A) CLEAR   Specific Gravity, Urine 1.006 1.005 - 1.030   pH 7.0 5.0 - 8.0   Glucose, UA NEGATIVE NEGATIVE mg/dL   Hgb urine dipstick NEGATIVE NEGATIVE   Bilirubin Urine NEGATIVE NEGATIVE   Ketones, ur 5 (A) NEGATIVE mg/dL   Protein, ur NEGATIVE NEGATIVE mg/dL   Nitrite NEGATIVE NEGATIVE   Leukocytes,Ua TRACE (A) NEGATIVE   RBC / HPF 0-5 0 - 5 RBC/hpf   WBC, UA 0-5 0 - 5 WBC/hpf   Bacteria, UA RARE (A) NONE SEEN   Squamous Epithelial / LPF 11-20 0 - 5   Mucus PRESENT   Fern Test     Status: None   Collection Time: 10/22/21  4:28 PM  Result Value Ref Range   POCT Fern Test Negative = intact amniotic membranes     Imaging No results found.  MAU Course  Procedures Lab Orders         Urinalysis, Routine w reflex microscopic Urine, Clean Catch         Fern Test    No orders of the defined types were placed in this encounter.  Imaging Orders         [place] MFM FETAL BPP WO NON STRESS      MDM Sterile speculum exam performed. No pooling of fluid. Fern negative No regular contractions on TOCO & cervix closed  Category 1 tracing but not reactive & unable to maintain continuous monitoring due to fetal movement & maternal body habitus. BPP ordered - 8/8 Assessment and Plan   1. Braxton Hick's contraction  -Likely due to LGA baby & polyhydramnios. Cervix closed & no evidence of labor at this time  2. Encounter for suspected PROM, with rupture of membranes not found  -Fern negative & no pooling  3. Polyhydramnios in third trimester complication, single or unspecified fetus   4. [redacted] weeks gestation of pregnancy      Jorje Guild, NP 10/22/21 8:09 PM

## 2021-10-22 NOTE — MAU Note (Signed)
.  Rachel Mendez is a 36 y.o. at [redacted]w[redacted]d here in MAU reporting: ctx every 3-5 minutes apart that started around 1000 this morning. Denies VB but has noticed she's been leaking clear fluid since 1000. +FM.  Pain score: 6  Lab orders placed from triage:  UA

## 2021-10-22 NOTE — Telephone Encounter (Signed)
TC from pt c/o pressure and contractions since 10am and per pt  "quarter sized spots" of discharge  +FM, no vaginal bleeding, No leaking,  Pt states she feels like something is going to come out.  Pt advised to go to MAU today. Pt has upcoming appt tomorrow.

## 2021-10-23 ENCOUNTER — Encounter: Payer: Self-pay | Admitting: Obstetrics and Gynecology

## 2021-10-23 ENCOUNTER — Other Ambulatory Visit: Payer: Medicaid Other

## 2021-10-23 ENCOUNTER — Ambulatory Visit (INDEPENDENT_AMBULATORY_CARE_PROVIDER_SITE_OTHER): Payer: Medicaid Other | Admitting: Obstetrics and Gynecology

## 2021-10-23 VITALS — BP 132/83 | HR 120 | Wt 283.4 lb

## 2021-10-23 DIAGNOSIS — R768 Other specified abnormal immunological findings in serum: Secondary | ICD-10-CM

## 2021-10-23 DIAGNOSIS — O403XX0 Polyhydramnios, third trimester, not applicable or unspecified: Secondary | ICD-10-CM

## 2021-10-23 DIAGNOSIS — O99280 Endocrine, nutritional and metabolic diseases complicating pregnancy, unspecified trimester: Secondary | ICD-10-CM

## 2021-10-23 DIAGNOSIS — O24410 Gestational diabetes mellitus in pregnancy, diet controlled: Secondary | ICD-10-CM

## 2021-10-23 DIAGNOSIS — O99210 Obesity complicating pregnancy, unspecified trimester: Secondary | ICD-10-CM

## 2021-10-23 DIAGNOSIS — Z6841 Body Mass Index (BMI) 40.0 and over, adult: Secondary | ICD-10-CM | POA: Insufficient documentation

## 2021-10-23 DIAGNOSIS — Z98891 History of uterine scar from previous surgery: Secondary | ICD-10-CM

## 2021-10-23 DIAGNOSIS — E0590 Thyrotoxicosis, unspecified without thyrotoxic crisis or storm: Secondary | ICD-10-CM

## 2021-10-23 DIAGNOSIS — Z3A34 34 weeks gestation of pregnancy: Secondary | ICD-10-CM

## 2021-10-23 DIAGNOSIS — E0500 Thyrotoxicosis with diffuse goiter without thyrotoxic crisis or storm: Secondary | ICD-10-CM

## 2021-10-23 NOTE — Progress Notes (Signed)
PRENATAL VISIT NOTE  Subjective:  Rachel Mendez is a 36 y.o. G2P1001 at [redacted]w[redacted]d being seen today for ongoing prenatal care.  She is currently monitored for the following issues for this high-risk pregnancy and has Supervision of high risk pregnancy, antepartum; Previous cesarean section; Maternal morbid obesity, antepartum (HCC); Mixed incontinence; Anxiety; Depression; ADD (attention deficit disorder); Asthma; Low thyroid stimulating hormone (TSH) level; Hyperthyroidism in pregnancy, antepartum; Graves disease; Request for sterilization; Gestational diabetes mellitus (GDM), antepartum; Polyhydramnios affecting pregnancy in third trimester; BMI 45.0-49.9, adult (HCC); and Biological false positive RPR test on their problem list.  Patient reports no complaints.  Contractions: Irritability. Vag. Bleeding: None.  Movement: Present. Denies leaking of fluid.   The following portions of the patient's history were reviewed and updated as appropriate: allergies, current medications, past family history, past medical history, past social history, past surgical history and problem list.   Objective:   Vitals:   10/23/21 0835  BP: 132/83  Pulse: (!) 120  Weight: 283 lb 6.4 oz (128.5 kg)    Fetal Status: Fetal Heart Rate (bpm): 125   Movement: Present     General:  Alert, oriented and cooperative. Patient is in no acute distress.  Skin: Skin is warm and dry. No rash noted.   Cardiovascular: Normal heart rate noted  Respiratory: Normal respiratory effort, no problems with respiration noted  Abdomen: Soft, gravid, appropriate for gestational age.  Pain/Pressure: Present     Pelvic: Cervical exam deferred        Extremities: Normal range of motion.  Edema: Trace  Mental Status: Normal mood and affect. Normal behavior. Normal judgment and thought content.   Assessment and Plan:  Pregnancy: G2P1001 at [redacted]w[redacted]d 1. Polyhydramnios in third trimester complication, single or unspecified fetus 35 yesterday  in MAU (labor eval), bpp 8/8. I d/w her that I recommend switching from [facility] office qwk BPPs to MFM ones given her high risk nature. I also d/w her that mfm will determine delivery timing at these u/s and may recommend 37wk delivery given the degree of poly, gdm, bmi 40s and hyperthyroidism - [place] MFM FETAL BPP WO NON STRESS; Future  2. Hyperthyroidism, Graves in pregnancy, antepartum On ptu 50 bid. Recent ft4 wnl on 8/8. Referral placed for endocrine - Ambulatory referral to Endocrinology  3. Diet controlled gestational diabetes mellitus (GDM), antepartum Some AM fastings in the high 90s/low 100s but with normal post prandials. Continue a1 control for now  4. [redacted] weeks gestation of pregnancy Btl form UTD  5. Previous cesarean section Sign form for tolac previously. I told her will d/w her more as she gets closer to delivery. Increased unstable lie risk given poly  6. Polyhydramnios affecting pregnancy in third trimester  7. Maternal morbid obesity, antepartum (HCC)  8. BMI 45.0-49.9, adult (HCC)  9. Biological false positive RPR test F/u admit rpr  Preterm labor symptoms and general obstetric precautions including but not limited to vaginal bleeding, contractions, leaking of fluid and fetal movement were reviewed in detail with the patient. Please refer to After Visit Summary for other counseling recommendations.   Return in about 2 weeks (around 11/06/2021) for in person, high risk ob, md visit.  Future Appointments  Date Time Provider Department Center  10/31/2021 10:15 AM [facility] NURSE [facility]  10/31/2021 10:30 AM [facility] US2 [facility]  11/06/2021  8:35 AM Anyanwu, Jethro Bastos, MD [facility]  11/13/2021  8:35 AM [place] Large, Jethro Bastos, MD [facility]  11/15/2021 10:45 AM [facility] NURSE [facility]  [facility]  11/15/2021 11:00 AM [facility] US1 [facility]  11/20/2021  8:35 AM Constant, Gigi Gin, MD [facility]  11/27/2021  8:35 AM Milas Hock, MD [facility]     [place] Bing, MD

## 2021-10-31 ENCOUNTER — Ambulatory Visit: Payer: Medicaid Other | Admitting: *Deleted

## 2021-10-31 ENCOUNTER — Ambulatory Visit: Payer: Medicaid Other | Attending: Obstetrics and Gynecology

## 2021-10-31 ENCOUNTER — Other Ambulatory Visit: Payer: Self-pay | Admitting: *Deleted

## 2021-10-31 VITALS — BP 137/67 | HR 82

## 2021-10-31 DIAGNOSIS — E0590 Thyrotoxicosis, unspecified without thyrotoxic crisis or storm: Secondary | ICD-10-CM

## 2021-10-31 DIAGNOSIS — E669 Obesity, unspecified: Secondary | ICD-10-CM

## 2021-10-31 DIAGNOSIS — O99213 Obesity complicating pregnancy, third trimester: Secondary | ICD-10-CM

## 2021-10-31 DIAGNOSIS — O280 Abnormal hematological finding on antenatal screening of mother: Secondary | ICD-10-CM | POA: Diagnosis not present

## 2021-10-31 DIAGNOSIS — O34219 Maternal care for unspecified type scar from previous cesarean delivery: Secondary | ICD-10-CM

## 2021-10-31 DIAGNOSIS — O0990 Supervision of high risk pregnancy, unspecified, unspecified trimester: Secondary | ICD-10-CM

## 2021-10-31 DIAGNOSIS — O24419 Gestational diabetes mellitus in pregnancy, unspecified control: Secondary | ICD-10-CM

## 2021-10-31 DIAGNOSIS — Z98891 History of uterine scar from previous surgery: Secondary | ICD-10-CM | POA: Insufficient documentation

## 2021-10-31 DIAGNOSIS — O403XX0 Polyhydramnios, third trimester, not applicable or unspecified: Secondary | ICD-10-CM | POA: Diagnosis present

## 2021-10-31 DIAGNOSIS — Z3A35 35 weeks gestation of pregnancy: Secondary | ICD-10-CM

## 2021-10-31 DIAGNOSIS — O99283 Endocrine, nutritional and metabolic diseases complicating pregnancy, third trimester: Secondary | ICD-10-CM

## 2021-10-31 DIAGNOSIS — O24410 Gestational diabetes mellitus in pregnancy, diet controlled: Secondary | ICD-10-CM | POA: Diagnosis not present

## 2021-10-31 DIAGNOSIS — O409XX0 Polyhydramnios, unspecified trimester, not applicable or unspecified: Secondary | ICD-10-CM

## 2021-11-01 ENCOUNTER — Telehealth: Payer: Self-pay | Admitting: Obstetrics and Gynecology

## 2021-11-01 ENCOUNTER — Ambulatory Visit: Payer: Medicaid Other

## 2021-11-01 ENCOUNTER — Other Ambulatory Visit: Payer: Self-pay | Admitting: Obstetrics and Gynecology

## 2021-11-01 ENCOUNTER — Other Ambulatory Visit: Payer: Medicaid Other

## 2021-11-01 DIAGNOSIS — O0993 Supervision of high risk pregnancy, unspecified, third trimester: Secondary | ICD-10-CM

## 2021-11-01 NOTE — Telephone Encounter (Signed)
OB Telephone Note Patient called and d/w her re: poly 35 afi and GDM and recommendation for 37wk delivery. Risks of early term delivery d/w her and she is amenable with delivery at 37wks. Pt set up for TOLAC IOL on 9/10 during the day time  Cornelia Copa MD Attending Center for [employer] (Faculty Practice) 11/01/2021 Time: [phone]

## 2021-11-02 ENCOUNTER — Encounter (HOSPITAL_COMMUNITY): Payer: Self-pay | Admitting: *Deleted

## 2021-11-02 ENCOUNTER — Telehealth (HOSPITAL_COMMUNITY): Payer: Self-pay | Admitting: *Deleted

## 2021-11-02 NOTE — Telephone Encounter (Signed)
Preadmission screen  

## 2021-11-03 ENCOUNTER — Other Ambulatory Visit: Payer: Self-pay | Admitting: Advanced Practice Midwife

## 2021-11-06 ENCOUNTER — Encounter: Payer: Self-pay | Admitting: Obstetrics & Gynecology

## 2021-11-06 ENCOUNTER — Encounter: Payer: Self-pay | Admitting: Obstetrics and Gynecology

## 2021-11-06 ENCOUNTER — Encounter: Payer: Medicaid Other | Admitting: Obstetrics & Gynecology

## 2021-11-07 ENCOUNTER — Ambulatory Visit (INDEPENDENT_AMBULATORY_CARE_PROVIDER_SITE_OTHER): Payer: Medicaid Other | Admitting: Family Medicine

## 2021-11-07 ENCOUNTER — Other Ambulatory Visit (HOSPITAL_COMMUNITY)
Admission: RE | Admit: 2021-11-07 | Discharge: 2021-11-07 | Disposition: A | Discharge status: Home / Self Care | Payer: Medicaid Other | Source: Ambulatory Visit | Attending: Family Medicine | Admitting: Family Medicine

## 2021-11-07 VITALS — BP 139/71 | HR 109 | Wt 287.0 lb

## 2021-11-07 DIAGNOSIS — O403XX0 Polyhydramnios, third trimester, not applicable or unspecified: Secondary | ICD-10-CM

## 2021-11-07 DIAGNOSIS — O99280 Endocrine, nutritional and metabolic diseases complicating pregnancy, unspecified trimester: Secondary | ICD-10-CM

## 2021-11-07 DIAGNOSIS — O24410 Gestational diabetes mellitus in pregnancy, diet controlled: Secondary | ICD-10-CM

## 2021-11-07 DIAGNOSIS — E0590 Thyrotoxicosis, unspecified without thyrotoxic crisis or storm: Secondary | ICD-10-CM

## 2021-11-07 DIAGNOSIS — O0990 Supervision of high risk pregnancy, unspecified, unspecified trimester: Secondary | ICD-10-CM | POA: Diagnosis not present

## 2021-11-07 DIAGNOSIS — O99283 Endocrine, nutritional and metabolic diseases complicating pregnancy, third trimester: Secondary | ICD-10-CM

## 2021-11-07 DIAGNOSIS — O0993 Supervision of high risk pregnancy, unspecified, third trimester: Secondary | ICD-10-CM

## 2021-11-07 DIAGNOSIS — Z98891 History of uterine scar from previous surgery: Secondary | ICD-10-CM

## 2021-11-07 DIAGNOSIS — Z3A36 36 weeks gestation of pregnancy: Secondary | ICD-10-CM

## 2021-11-07 NOTE — Progress Notes (Signed)
Fasting running around 100

## 2021-11-07 NOTE — Progress Notes (Signed)
   PRENATAL VISIT NOTE  Subjective:  Rachel Mendez is a 36 y.o. G2P1001 at [redacted]w[redacted]d being seen today for ongoing prenatal care.  She is currently monitored for the following issues for this low-risk pregnancy and has Supervision of high risk pregnancy, antepartum; Previous cesarean section; Maternal morbid obesity, antepartum (HCC); Mixed incontinence; Anxiety; Depression; ADD (attention deficit disorder); Asthma; Hyperthyroidism in pregnancy, antepartum; Graves disease; Request for sterilization; Gestational diabetes mellitus (GDM), antepartum; Polyhydramnios affecting pregnancy in third trimester; BMI 45.0-49.9, adult (HCC); and Biological false positive RPR test on their problem list.  Patient reports no complaints.  Contractions: Not present. Vag. Bleeding: None.  Movement: Present. Denies leaking of fluid.   The following portions of the patient's history were reviewed and updated as appropriate: allergies, current medications, past family history, past medical history, past social history, past surgical history and problem list.   Objective:   Vitals:   11/07/21 0915  BP: 139/71  Pulse: (!) 109  Weight: 287 lb (130.2 kg)    Fetal Status: Fetal Heart Rate (bpm): 133   Movement: Present  Presentation: Vertex  General:  Alert, oriented and cooperative. Patient is in no acute distress.  Skin: Skin is warm and dry. No rash noted.   Cardiovascular: Normal heart rate noted  Respiratory: Normal respiratory effort, no problems with respiration noted  Abdomen: Soft, gravid, appropriate for gestational age.  Pain/Pressure: Present     Pelvic: Cervical exam performed in the presence of a chaperone Dilation: Fingertip Effacement (%): Thick Station: Ballotable  Extremities: Normal range of motion.  Edema: Trace  Mental Status: Normal mood and affect. Normal behavior. Normal judgment and thought content.   Assessment and Plan:  Pregnancy: G2P1001 at [redacted]w[redacted]d 1. Hyperthyroidism in pregnancy,  antepartum On PTU, but not well controlled  2. Diet controlled gestational diabetes mellitus (GDM), antepartum Fastings are out with poly and LGA for IOL at 37 wks Risks reviewed  3. Supervision of high risk pregnancy, antepartum Cultures today - Strep Gp B NAA - Cervicovaginal ancillary only( [place])  4. Previous cesarean section For TOLAC-consent signed  5. Polyhydramnios affecting pregnancy in third trimester In testing  Preterm labor symptoms and general obstetric precautions including but not limited to vaginal bleeding, contractions, leaking of fluid and fetal movement were reviewed in detail with the patient. Please refer to After Visit Summary for other counseling recommendations.   Return in 1 week (on 11/14/2021).  Future Appointments  Date Time Provider Department Center  11/08/2021  8:30 AM [facility] NURSE [facility]  11/08/2021  8:45 AM [facility] US7 [facility]  11/11/2021  7:30 AM [facility] SCHED ROOM [facility]INDC None  11/15/2021 10:45 AM [facility] NURSE [facility]  11/15/2021 11:00 AM [facility] US1 [facility]    Reva Bores, MD

## 2021-11-08 ENCOUNTER — Ambulatory Visit: Payer: Medicaid Other

## 2021-11-08 ENCOUNTER — Other Ambulatory Visit: Payer: Medicaid Other

## 2021-11-08 ENCOUNTER — Ambulatory Visit: Payer: Medicaid Other | Admitting: *Deleted

## 2021-11-08 ENCOUNTER — Ambulatory Visit: Payer: Medicaid Other | Attending: Maternal & Fetal Medicine

## 2021-11-08 VITALS — BP 134/76 | HR 89

## 2021-11-08 DIAGNOSIS — Z98891 History of uterine scar from previous surgery: Secondary | ICD-10-CM | POA: Insufficient documentation

## 2021-11-08 DIAGNOSIS — O99213 Obesity complicating pregnancy, third trimester: Secondary | ICD-10-CM | POA: Diagnosis present

## 2021-11-08 DIAGNOSIS — E0590 Thyrotoxicosis, unspecified without thyrotoxic crisis or storm: Secondary | ICD-10-CM | POA: Diagnosis not present

## 2021-11-08 DIAGNOSIS — O99283 Endocrine, nutritional and metabolic diseases complicating pregnancy, third trimester: Secondary | ICD-10-CM | POA: Diagnosis not present

## 2021-11-08 DIAGNOSIS — O24410 Gestational diabetes mellitus in pregnancy, diet controlled: Secondary | ICD-10-CM

## 2021-11-08 DIAGNOSIS — O0990 Supervision of high risk pregnancy, unspecified, unspecified trimester: Secondary | ICD-10-CM

## 2021-11-08 DIAGNOSIS — O24419 Gestational diabetes mellitus in pregnancy, unspecified control: Secondary | ICD-10-CM | POA: Diagnosis present

## 2021-11-08 DIAGNOSIS — O403XX0 Polyhydramnios, third trimester, not applicable or unspecified: Secondary | ICD-10-CM | POA: Diagnosis not present

## 2021-11-08 DIAGNOSIS — O409XX0 Polyhydramnios, unspecified trimester, not applicable or unspecified: Secondary | ICD-10-CM | POA: Insufficient documentation

## 2021-11-08 DIAGNOSIS — Z3A36 36 weeks gestation of pregnancy: Secondary | ICD-10-CM

## 2021-11-08 DIAGNOSIS — E669 Obesity, unspecified: Secondary | ICD-10-CM

## 2021-11-08 DIAGNOSIS — O34219 Maternal care for unspecified type scar from previous cesarean delivery: Secondary | ICD-10-CM

## 2021-11-08 LAB — CERVICOVAGINAL ANCILLARY ONLY
Chlamydia: NEGATIVE
Comment: NEGATIVE
Comment: NORMAL
Neisseria Gonorrhea: NEGATIVE

## 2021-11-09 LAB — STREP GP B NAA: Strep Gp B NAA: NEGATIVE

## 2021-11-11 ENCOUNTER — Inpatient Hospital Stay (HOSPITAL_COMMUNITY)
Admission: AD | Admit: 2021-11-11 | Discharge: 2021-11-15 | DRG: 807 | Disposition: A | Discharge status: Home / Self Care | Payer: Medicaid Other | Attending: Obstetrics and Gynecology | Admitting: Obstetrics and Gynecology

## 2021-11-11 ENCOUNTER — Inpatient Hospital Stay (HOSPITAL_COMMUNITY): Payer: Medicaid Other

## 2021-11-11 ENCOUNTER — Other Ambulatory Visit: Payer: Self-pay

## 2021-11-11 ENCOUNTER — Encounter (HOSPITAL_COMMUNITY): Payer: Self-pay | Admitting: Obstetrics and Gynecology

## 2021-11-11 DIAGNOSIS — O0993 Supervision of high risk pregnancy, unspecified, third trimester: Secondary | ICD-10-CM

## 2021-11-11 DIAGNOSIS — O99284 Endocrine, nutritional and metabolic diseases complicating childbirth: Secondary | ICD-10-CM | POA: Diagnosis present

## 2021-11-11 DIAGNOSIS — Z3A37 37 weeks gestation of pregnancy: Secondary | ICD-10-CM | POA: Diagnosis not present

## 2021-11-11 DIAGNOSIS — Z91040 Latex allergy status: Secondary | ICD-10-CM

## 2021-11-11 DIAGNOSIS — O134 Gestational [pregnancy-induced] hypertension without significant proteinuria, complicating childbirth: Secondary | ICD-10-CM | POA: Diagnosis present

## 2021-11-11 DIAGNOSIS — O0990 Supervision of high risk pregnancy, unspecified, unspecified trimester: Principal | ICD-10-CM

## 2021-11-11 DIAGNOSIS — O24420 Gestational diabetes mellitus in childbirth, diet controlled: Secondary | ICD-10-CM | POA: Diagnosis present

## 2021-11-11 DIAGNOSIS — O9902 Anemia complicating childbirth: Secondary | ICD-10-CM | POA: Diagnosis present

## 2021-11-11 DIAGNOSIS — O99214 Obesity complicating childbirth: Secondary | ICD-10-CM | POA: Diagnosis present

## 2021-11-11 DIAGNOSIS — Z6841 Body Mass Index (BMI) 40.0 and over, adult: Secondary | ICD-10-CM

## 2021-11-11 DIAGNOSIS — E0500 Thyrotoxicosis with diffuse goiter without thyrotoxic crisis or storm: Secondary | ICD-10-CM | POA: Diagnosis present

## 2021-11-11 DIAGNOSIS — O34219 Maternal care for unspecified type scar from previous cesarean delivery: Secondary | ICD-10-CM | POA: Diagnosis not present

## 2021-11-11 DIAGNOSIS — Z98891 History of uterine scar from previous surgery: Secondary | ICD-10-CM

## 2021-11-11 DIAGNOSIS — O24419 Gestational diabetes mellitus in pregnancy, unspecified control: Secondary | ICD-10-CM | POA: Diagnosis present

## 2021-11-11 DIAGNOSIS — O139 Gestational [pregnancy-induced] hypertension without significant proteinuria, unspecified trimester: Secondary | ICD-10-CM | POA: Diagnosis not present

## 2021-11-11 DIAGNOSIS — O403XX0 Polyhydramnios, third trimester, not applicable or unspecified: Secondary | ICD-10-CM | POA: Diagnosis present

## 2021-11-11 DIAGNOSIS — O34211 Maternal care for low transverse scar from previous cesarean delivery: Secondary | ICD-10-CM | POA: Diagnosis not present

## 2021-11-11 DIAGNOSIS — O24410 Gestational diabetes mellitus in pregnancy, diet controlled: Secondary | ICD-10-CM | POA: Diagnosis present

## 2021-11-11 LAB — TYPE AND SCREEN
ABO/RH(D): O POS
Antibody Screen: NEGATIVE

## 2021-11-11 LAB — T4, FREE: Free T4: 1.24 ng/dL — ABNORMAL HIGH (ref 0.61–1.12)

## 2021-11-11 LAB — CBC
HCT: 34.8 % — ABNORMAL LOW (ref 36.0–46.0)
Hemoglobin: 11.4 g/dL — ABNORMAL LOW (ref 12.0–15.0)
MCH: 25.2 pg — ABNORMAL LOW (ref 26.0–34.0)
MCHC: 32.8 g/dL (ref 30.0–36.0)
MCV: 76.8 fL — ABNORMAL LOW (ref 80.0–100.0)
Platelets: 265 10*3/uL (ref 150–400)
RBC: 4.53 MIL/uL (ref 3.87–5.11)
RDW: 14.2 % (ref 11.5–15.5)
WBC: 9 10*3/uL (ref 4.0–10.5)
nRBC: 0 % (ref 0.0–0.2)

## 2021-11-11 LAB — GLUCOSE, CAPILLARY
Glucose-Capillary: 92 mg/dL (ref 70–99)
Glucose-Capillary: 108 mg/dL — ABNORMAL HIGH (ref 70–99)
Glucose-Capillary: 90 mg/dL (ref 70–99)

## 2021-11-11 LAB — TSH: TSH: <0.01 u[IU]/mL — ABNORMAL LOW (ref 0.350–4.500)

## 2021-11-11 MED ORDER — PROPYLTHIOURACIL 50 MG PO TABS
100.0000 mg | ORAL_TABLET | Freq: Two times a day (BID) | ORAL | Status: DC
  Administered 2021-11-11 – 2021-11-13 (×4): 100 mg via ORAL
  Filled 2021-11-11 (×6): qty 2

## 2021-11-11 MED ORDER — SERTRALINE HCL 50 MG PO TABS
50.0000 mg | ORAL_TABLET | Freq: Every day | ORAL | Status: DC
  Administered 2021-11-12 – 2021-11-13 (×2): 50 mg via ORAL
  Filled 2021-11-11 (×2): qty 1

## 2021-11-11 MED ORDER — OXYTOCIN-SODIUM CHLORIDE 30-0.9 UT/500ML-% IV SOLN
2.5000 [IU]/h | INTRAVENOUS | Status: DC
  Filled 2021-11-11: qty 500

## 2021-11-11 MED ORDER — OXYTOCIN BOLUS FROM INFUSION
333.0000 mL | Freq: Once | INTRAVENOUS | Status: AC
  Administered 2021-11-13: 333 mL via INTRAVENOUS

## 2021-11-11 MED ORDER — ONDANSETRON HCL 4 MG/2ML IJ SOLN
4.0000 mg | Freq: Four times a day (QID) | INTRAMUSCULAR | Status: DC | PRN
Start: 2021-11-11 — End: 2021-11-13
  Administered 2021-11-12 – 2021-11-13 (×2): 4 mg via INTRAVENOUS
  Filled 2021-11-11 (×2): qty 2

## 2021-11-11 MED ORDER — ACETAMINOPHEN 325 MG PO TABS
650.0000 mg | ORAL_TABLET | ORAL | Status: DC | PRN
Start: 2021-11-11 — End: 2021-11-13

## 2021-11-11 MED ORDER — OXYTOCIN-SODIUM CHLORIDE 30-0.9 UT/500ML-% IV SOLN
1.0000 m[IU]/min | INTRAVENOUS | Status: DC
  Administered 2021-11-11: 1 m[IU]/min via INTRAVENOUS

## 2021-11-11 MED ORDER — LACTATED RINGERS IV SOLN: INTRAVENOUS | Status: DC

## 2021-11-11 MED ORDER — TERBUTALINE SULFATE 1 MG/ML IJ SOLN: 0.2500 mg | Freq: Once | INTRAMUSCULAR | Status: DC | PRN

## 2021-11-11 MED ORDER — LACTATED RINGERS IV SOLN: 500.0000 mL | INTRAVENOUS | Status: DC | PRN

## 2021-11-11 MED ORDER — LIDOCAINE HCL (PF) 1 % IJ SOLN
30.0000 mL | INTRAMUSCULAR | Status: AC | PRN
Start: 2021-11-11 — End: 2021-11-13
  Administered 2021-11-13: 30 mL via SUBCUTANEOUS
  Filled 2021-11-11: qty 30

## 2021-11-11 MED ORDER — FENTANYL CITRATE (PF) 100 MCG/2ML IJ SOLN
50.0000 ug | INTRAMUSCULAR | Status: DC | PRN
  Administered 2021-11-11 – 2021-11-12 (×5): 50 ug via INTRAVENOUS
  Filled 2021-11-11 (×5): qty 2

## 2021-11-11 MED ORDER — SOD CITRATE-CITRIC ACID 500-334 MG/5ML PO SOLN
30.0000 mL | ORAL | Status: DC | PRN
Start: 2021-11-11 — End: 2021-11-13

## 2021-11-11 NOTE — H&P (Signed)
OBSTETRIC ADMISSION HISTORY AND PHYSICAL  Keeanna Villafranca is a 36 y.o. female G2P1001 with IUP at [redacted]w[redacted]d presenting for IOL for polyhydramnios, hyperthryoidism on PTU, gDMA1. Had a previous c-section for arrest of dilation, desires a TOLAC. She reports +FMs, No LOF, no VB, no blurry vision, headaches or peripheral edema, and RUQ pain.  She plans on bottle feeding. She request BTL for birth control. Consent for BTL and TOLAC signed 7/11. She received her prenatal care at [place].  Dating: By early [place] --->  Estimated Date of Delivery: 12/01/21  Sono:    '@[redacted]w[redacted]d'$ , CWD, normal anatomy, cephalic presentation, 1610R, 91% EFW   Prenatal History/Complications:  Patient Active Problem List   Diagnosis Date Noted   GDM, class A1 11/11/2021   Polyhydramnios affecting pregnancy in third trimester 10/23/2021   BMI 45.0-49.9, adult ([place]) 10/23/2021   Gestational diabetes mellitus (GDM), antepartum 09/12/2021   Request for sterilization 09/11/2021   Hyperthyroidism in pregnancy, antepartum 07/21/2021   Graves disease 07/21/2021   Maternal morbid obesity, antepartum ([place]) 05/16/2021   Supervision of high risk pregnancy, antepartum 04/11/2021   Previous cesarean section 04/11/2021   Mixed incontinence 12/09/2013    Nursing Staff Provider  Office Location  [place] Dating  Early [place]  [facility] Model [x ] Traditional $RemoveBefor'[ ]'ZdqoIPZPMRfi$  Centering $RemoveBe'[ ]'yKeUqJwIV$  Mom-Baby Dyad    Language   English Anatomy US  WNL but incomplete-f/u 4 wks  Flu Vaccine   Genetic/Carrier Screen  NIPS:high risk, low FF    AFP:    [place]:  TDaP Vaccine   10/09/21 Hgb A1C or  GTT Early 5.2 Third trimester   COVID Vaccine  x2 Moderna   LAB RESULTS   Rhogam   Blood Type O/Positive/-- (03/15 0949)   Baby Feeding Plan  Formula Antibody Comment, See Final Results (03/15 0949)  Negative on separate testing  Contraception  tubal Rubella 1.25 (03/15 0949)  Circumcision  n/a RPR Non Reactive (03/15 0949)   Pediatrician   [facility] HBsAg Negative (03/15  0949)   Support Person  [place] (FOB)  HCVAb Negative  Prenatal Classes  Declined HIV Non Reactive (03/15 0949)     BTL Consent 09/11/21 GBS  Negative  VBAC Consent 09/11/21 Pap        DME Rx [x ] BP cuff $Remov'[ ]'qHNETP$  Weight Scale Waterbirth  $RemoveBef'[ ]'RdWggbiidp$  Class $Remo'[ ]'eSQau$  Consent $Remove'[ ]'zHHEuNz$  CNM visit  PHQ9 & GAD7 [  ] new OB [  ] 28 weeks  [  ] 36 weeks Induction  $RemoveBe'[ ]'kghLIztOQ$  Orders Entered $RemoveBefore'[ ]'gTLErpFvwfBZc$ Foley Y/N    Past Medical History: Past Medical History:  Diagnosis Date   Anxiety    Asthma    Depression    Gestational diabetes    Graves disease 07/21/2021    Past Surgical History: Past Surgical History:  Procedure Laterality Date   CESAREAN SECTION      Obstetrical History: OB History     Gravida  2   Para  1   Term  1   Preterm      AB      Living  1      SAB      IAB      Ectopic      Multiple      Live Births  1           Social History Social History   Socioeconomic History   Marital status: Single    Spouse name: Not on file   Number  of children: Not on file   Years of education: Not on file   Highest education level: Not on file  Occupational History   Occupation: Banker    Comment: [employer]  Tobacco Use   Smoking status: Never   Smokeless tobacco: Never  Vaping Use   Vaping Use: Former   Quit date: 04/04/2021   Substances: Nicotine  Substance and Sexual Activity   Alcohol use: Not Currently   Drug use: Not Currently   Sexual activity: Yes  Other Topics Concern   Not on file  Social History Narrative   Not on file   Social Determinants of Health   Financial Resource Strain: Not on file  Food Insecurity: Not on file  Transportation Needs: Not on file  Physical Activity: Not on file  Stress: Not on file  Social Connections: Not on file    Family History: Family History  Problem Relation Age of Onset   Anxiety disorder Mother    Depression Mother    Hashimoto's thyroiditis Mother    ADD / ADHD Sister    Epilepsy Sister    Epilepsy Maternal Grandmother     Diabetes Paternal Grandfather        Type 1    Allergies: Allergies  Allergen Reactions   Bee Venom Anaphylaxis   Latex Hives    Medications Prior to Admission  Medication Sig Dispense Refill Last Dose   Accu-Chek Softclix Lancets lancets 1 each by Other route 4 (four) times daily. 100 each 12    albuterol (ACCUNEB) 0.63 MG/3ML nebulizer solution SMARTSIG:Via Nebulizer 2-3 Times Daily PRN (Patient not taking: Reported on 10/23/2021)      Blood Glucose Monitoring Suppl (ACCU-CHEK GUIDE) w/Device KIT 1 Device by Does not apply route 4 (four) times daily. 1 kit 0    glucose blood (ACCU-CHEK GUIDE) test strip Use to check blood sugars four times a day was instructed 50 each 12    pantoprazole (PROTONIX) 40 MG tablet Take 1 tablet (40 mg total) by mouth daily. 90 tablet 2    Prenatal Vit-Fe Fumarate-FA (PRENATAL MULTIVITAMIN) TABS tablet Take 1 tablet by mouth daily at 12 noon.      propylthiouracil (PTU) 50 MG tablet Take 2 tablets (100 mg total) by mouth 2 (two) times daily. 120 tablet 2    sertraline (ZOLOFT) 50 MG tablet Take 50 mg by mouth daily.      VENTOLIN HFA 108 (90 Base) MCG/ACT inhaler SMARTSIG:1 Puff(s) Via Inhaler Every 6 Hours PRN (Patient not taking: Reported on 10/23/2021)        Review of Systems   All systems reviewed and negative except as stated in HPI  Blood pressure 135/72, pulse 77, temperature 98.2 F (36.8 C), temperature source Oral, resp. rate 17, height 5' 5.5" (1.664 m), weight 129.1 kg, last menstrual period 01/25/2021. General appearance: alert, cooperative, and appears stated age Lungs: clear to auscultation bilaterally Heart: regular rate and rhythm Abdomen: soft, non-tender; bowel sounds normal Pelvic: see below Extremities: Homans sign is negative, no sign of DVT Presentation: cephalic Fetal monitoring 130 bpm, moderate variability, +accels, no decels  Uterine activity: irritability and irregular contractions.  Dilation: Fingertip Effacement  (%): Thick Station: Ballotable Exam by:: Corin Formisano   Prenatal labs: ABO, Rh: --/--/O POS (09/10 1157) Antibody: NEG (09/10 1157) Rubella: 1.25 (03/15 0949) RPR: Reactive (07/11 0849)  HBsAg: Negative (03/15 0949)  HIV: Non Reactive (07/11 0849)  GBS: Negative/-- (09/06 0936)  1 hr Glucola - abnormal Genetic screening  high risk -  low fetal fraction Anatomy US normal  Prenatal Transfer Tool  Maternal Diabetes: Yes:  Diabetes Type:  Diet controlled Genetic Screening: Abnormal:  Results: Other: low Fetal fraction Maternal Ultrasounds/Referrals: Other: polyhydramnios Fetal Ultrasounds or other Referrals:  None Maternal Substance Abuse:  No Significant Maternal Medications:  Meds include: Other:  PTU Significant Maternal Lab Results:  Group B Strep negative Number of Prenatal Visits:greater than 3 verified prenatal visits Other Comments:  None  Results for orders placed or performed during the hospital encounter of 11/11/21 (from the past 24 hour(s))  Type and screen   Collection Time: 11/11/21 11:57 AM  Result Value Ref Range   ABO/RH(D) O POS    Antibody Screen NEG    Sample Expiration      11/14/2021,2359 Performed at [facility], [place] [address] [place] [address]   CBC   Collection Time: 11/11/21 11:58 AM  Result Value Ref Range   WBC 9.0 4.0 - 10.5 K/uL   RBC 4.53 3.87 - 5.11 MIL/uL   Hemoglobin 11.4 (L) 12.0 - 15.0 g/dL   HCT 34.8 (L) 36.0 - 46.0 %   MCV 76.8 (L) 80.0 - 100.0 fL   MCH 25.2 (L) 26.0 - 34.0 pg   MCHC 32.8 30.0 - 36.0 g/dL   RDW 14.2 11.5 - 15.5 %   Platelets 265 150 - 400 K/uL   nRBC 0.0 0.0 - 0.2 %  Glucose, capillary   Collection Time: 11/11/21 12:34 PM  Result Value Ref Range   Glucose-Capillary 92 70 - 99 mg/dL  Glucose, capillary   Collection Time: 11/11/21  4:37 PM  Result Value Ref Range   Glucose-Capillary 108 (H) 70 - 99 mg/dL    Patient Active Problem List   Diagnosis Date Noted   GDM, class A1 11/11/2021    Polyhydramnios affecting pregnancy in third trimester 10/23/2021   BMI 45.0-49.9, adult ([place]) 10/23/2021   Biological false positive RPR test 10/23/2021   Gestational diabetes mellitus (GDM), antepartum 09/12/2021   Request for sterilization 09/11/2021   Hyperthyroidism in pregnancy, antepartum 07/21/2021   Graves disease 07/21/2021   Maternal morbid obesity, antepartum ([place]) 05/16/2021   Anxiety 05/16/2021   Depression 05/16/2021   ADD (attention deficit disorder) 05/16/2021   Asthma 05/16/2021   Supervision of high risk pregnancy, antepartum 04/11/2021   Previous cesarean section 04/11/2021   Mixed incontinence 12/09/2013    Assessment/Plan:  Tosha Belgarde is a 36 y.o. G2P1001 at [redacted]w[redacted]d here for IOL- TOLAC due to polyhydramnios, hyperthryoidism on PTU, gDMA1. Previous c-section 3 years ago for arrest of dilation.  #Labor:admit to labor and delivery. Cervix closed. Start with low dose pitocin, 1 X1, max 4 units. Recheck in 2hrs to eval for possibility of FB #Pain: Desires epidural when active #FWB: Category 1 #ID:  GBS negative #MOF: bottle #MOC:BTL - consent signed #Circ:  N/a  Liliane Channel MD MPH OB Fellow, [place] for [place] 11/11/2021

## 2021-11-11 NOTE — Progress Notes (Signed)
Labor Progress Note Rachel Mendez is a 36 y.o. G2P1001 at [redacted]w[redacted]d presented for IOL d/t polyhydramnios, hyperthryoidism on PTU, gDMA1 S: doing well. Has been walking around. Now on pitocin at 4 units.  O:  BP 135/72   Pulse 77   Temp 98.2 F (36.8 C) (Oral)   Resp 17   Ht 5' 5.5" (1.664 m)   Wt 129.1 kg   LMP 01/25/2021   BMI 46.66 kg/m  EFM: 140bpm /moderate /+accels, no decels  CVE: Dilation: Fingertip Effacement (%): Thick Station: Ballotable Presentation: Vertex Exam by:: Franck Vinal   A&P: 36 y.o. G2P1001 [redacted]w[redacted]d IOL for polyhydramnios, hyperthryoidism on PTU, gDMA1 #Labor: Induction continues. On low dose pitocin at 4u. Attempted cooks balloon, but failed. Plan to continue low dose pitocin. Encouraged ambulation. Recheck again in about 4 hrs, sooner if indicated #Pain: desires epidural when in active labor #FWB: Category 1 #GBS negative  Hyperthyroidism - uncontrolled. On PTU. Thyroid labs ordered.  GDM A1- monitor POC Glucose.  Sheppard Evens MD MPH OB Fellow, Faculty Practice [facility], Center for [facility] 11/11/2021

## 2021-11-12 ENCOUNTER — Inpatient Hospital Stay (HOSPITAL_COMMUNITY): Payer: Medicaid Other | Admitting: Anesthesiology

## 2021-11-12 LAB — COMPREHENSIVE METABOLIC PANEL
ALT: 12 U/L (ref 0–44)
AST: 11 U/L — ABNORMAL LOW (ref 15–41)
Albumin: 2.3 g/dL — ABNORMAL LOW (ref 3.5–5.0)
Alkaline Phosphatase: 136 U/L — ABNORMAL HIGH (ref 38–126)
Anion gap: 8 (ref 5–15)
BUN: <5 mg/dL — ABNORMAL LOW (ref 6–20)
CO2: 20 mmol/L — ABNORMAL LOW (ref 22–32)
Calcium: 8.8 mg/dL — ABNORMAL LOW (ref 8.9–10.3)
Chloride: 106 mmol/L (ref 98–111)
Creatinine, Ser: 0.52 mg/dL (ref 0.44–1.00)
GFR, Estimated: >60 mL/min (ref >60)
Glucose, Bld: 89 mg/dL (ref 70–99)
Potassium: 3.6 mmol/L (ref 3.5–5.1)
Sodium: 134 mmol/L — ABNORMAL LOW (ref 135–145)
Total Bilirubin: 0.8 mg/dL (ref 0.3–1.2)
Total Protein: 5.9 g/dL — ABNORMAL LOW (ref 6.5–8.1)

## 2021-11-12 LAB — GLUCOSE, CAPILLARY
Glucose-Capillary: 83 mg/dL (ref 70–99)
Glucose-Capillary: 85 mg/dL (ref 70–99)
Glucose-Capillary: 86 mg/dL (ref 70–99)
Glucose-Capillary: 92 mg/dL (ref 70–99)
Glucose-Capillary: 96 mg/dL (ref 70–99)

## 2021-11-12 LAB — CBC
HCT: 34.7 % — ABNORMAL LOW (ref 36.0–46.0)
Hemoglobin: 11 g/dL — ABNORMAL LOW (ref 12.0–15.0)
MCH: 24.8 pg — ABNORMAL LOW (ref 26.0–34.0)
MCHC: 31.7 g/dL (ref 30.0–36.0)
MCV: 78.2 fL — ABNORMAL LOW (ref 80.0–100.0)
Platelets: 234 10*3/uL (ref 150–400)
RBC: 4.44 MIL/uL (ref 3.87–5.11)
RDW: 14.4 % (ref 11.5–15.5)
WBC: 7.8 10*3/uL (ref 4.0–10.5)
nRBC: 0 % (ref 0.0–0.2)

## 2021-11-12 LAB — RPR: RPR Ser Ql: NONREACTIVE

## 2021-11-12 MED ORDER — LACTATED RINGERS IV SOLN: 500.0000 mL | Freq: Once | INTRAVENOUS | Status: DC

## 2021-11-12 MED ORDER — OXYTOCIN-SODIUM CHLORIDE 30-0.9 UT/500ML-% IV SOLN
1.0000 m[IU]/min | INTRAVENOUS | Status: DC
  Filled 2021-11-12: qty 500

## 2021-11-12 MED ORDER — EPHEDRINE 5 MG/ML INJ
10.0000 mg | INTRAVENOUS | Status: DC | PRN
Start: 2021-11-12 — End: 2021-11-13

## 2021-11-12 MED ORDER — PHENYLEPHRINE 80 MCG/ML (10ML) SYRINGE FOR IV PUSH (FOR BLOOD PRESSURE SUPPORT)
80.0000 ug | PREFILLED_SYRINGE | INTRAVENOUS | Status: DC | PRN
Start: 2021-11-12 — End: 2021-11-13

## 2021-11-12 MED ORDER — PHENYLEPHRINE 80 MCG/ML (10ML) SYRINGE FOR IV PUSH (FOR BLOOD PRESSURE SUPPORT): 80.0000 ug | PREFILLED_SYRINGE | INTRAVENOUS | Status: DC | PRN

## 2021-11-12 MED ORDER — FENTANYL-BUPIVACAINE-NACL 0.5-0.125-0.9 MG/250ML-% EP SOLN
12.0000 mL/h | EPIDURAL | Status: DC | PRN
  Administered 2021-11-12: 12 mL/h via EPIDURAL
  Filled 2021-11-12 (×2): qty 250

## 2021-11-12 MED ORDER — DIPHENHYDRAMINE HCL 50 MG/ML IJ SOLN
12.5000 mg | INTRAMUSCULAR | Status: DC | PRN
  Administered 2021-11-13: 12.5 mg via INTRAVENOUS
  Filled 2021-11-12: qty 1

## 2021-11-12 MED ORDER — LIDOCAINE HCL (PF) 1 % IJ SOLN
INTRAMUSCULAR | Status: DC | PRN
  Administered 2021-11-12 (×2): 4 mL via EPIDURAL

## 2021-11-12 NOTE — Progress Notes (Signed)
Labor Progress Note Rachel Mendez is a 36 y.o. G2P1001 at [redacted]w[redacted]d presented for TOLAC due to polyhydramnios. S: Patient is resting comfortably, epidural in place, feels some contractions.  O:  BP (!) 140/86   Pulse 82   Temp 98.2 F (36.8 C) (Oral)   Resp 16   Ht 5' 5.5" (1.664 m)   Wt 129.1 kg   LMP 01/25/2021   SpO2 98%   BMI 46.66 kg/m  EFM: 120/moderate variability/accels +  CVE: Dilation: 6 Effacement (%): 50 Cervical Position: Posterior Station: -3 Presentation: Vertex Exam by:: Dr. Lanae Crumbly   A&P: 36 y.o. G2P1001 [redacted]w[redacted]d here for TOLAC due to polyhydramnios. #Labor: Progressing slowly, cervical change has progressed to 6 cm, however baby is still ballotable. Placed FSC in order to initiate slowly progressing AROM. Progressed to full AROM, baby head no longer ballotable. No cord felt. Will reevaluate in 2hrs.  #Pain: Epidural in place #FWB: Cat 1 #GBS negative #A1GDM - CBG appropriate #gHTN - Recent BP controlled, continue to monitor, patient asymptomatic. #Grave's disease - BID PTU  [name], MD Center for [facility], [facility] 9:37 PM

## 2021-11-12 NOTE — Progress Notes (Signed)
Labor Progress Note Rachel Mendez is a 36 y.o. G2P1001 at [redacted]w[redacted]d presented for IOL due to polyhydramnios with A2GDM.  S: Rachel Mendez is doing well. Resting comfortably.   O:  BP (!) 122/50   Pulse 78   Temp 98 F (36.7 C) (Oral)   Resp 16   Ht 5' 5.5" (1.664 m)   Wt 129.1 kg   LMP 01/25/2021   SpO2 98%   BMI 46.66 kg/m  EFM: 140/moderate variability/(+) acels, no decelerations   CVE: Dilation: 4 Effacement (%): 50 Cervical Position: Posterior Station: Ballotable Presentation: Vertex Exam by:: Dr, Jenene Slicker   A&P: 35 y.o. G2P1001 [redacted]w[redacted]d admitted to L&D for IOL. #Labor: Cervix hasn't changed. Pitocin currently at 53mu/min. Continue.  #Pain: Epidural in place #FWB: Cat I #GBS negative    #A1GDM- BG appropriate.   #gHTN- pre-E labs negative for SF, urine PCR pending. Blood pressures remain mild range and patient asymptomatic. Continue to monitor closely.  Rigel Filsinger Burgess Estelle, MD Resident Physician 6:14 PM

## 2021-11-12 NOTE — Progress Notes (Signed)
Patient ID: Darrion Macaulay, female   DOB: 03-Jul-1985, 36 y.o.   MRN: [id number]  Labor Progress Note Ymani Porcher is a 36 y.o. G2P1001 at [redacted]w[redacted]d presented for IOL for polyhydramnios with A2GDM  S:  Pt resting in bed, recently given pain meds so feeling much more relaxed and able to rest. Stated that last FB attempt was very uncomfortable for her but was willing to try with the speculum. Tolerated procedure well.  O:  BP 138/76   Pulse 75   Temp 98 F (36.7 C) (Oral)   Resp 17   Ht 5' 5.5" (1.664 m)   Wt 284 lb 11.2 oz (129.1 kg)   LMP 01/25/2021   BMI 46.66 kg/m  EFM: baseline 120 bpm/ moderate variability/ 15x15 accels/ no decels  Toco/IUPC: q2-7min SVE: Dilation: 1 Effacement (%): 50 Cervical Position: Posterior Station: Ballotable Presentation: Vertex Exam by:: Edd Arbour, CNM Pitocin: 4 mu/min  A/P: 36 y.o. G2P1001 [redacted]w[redacted]d  1. Labor: Latent labor, FB inserted, will continue Pitocin at 4cm until FB out 2. FWB: Cat 1 3. Pain: Fentanyl given, tolerated procedure and contractions well 4. A2GDM: Last CBG=92 5. Graves: home dose of PTU continued, labs resulted. Still abnormal but improved slightly 6. Mental health: continued zoloft dosing from home   Encouraged ambulation even just to/from bathroom with time spent sitting on the toilet, as well as position changes. Anticipate SVD.  Bernerd Limbo, CNM 12:33 AM

## 2021-11-12 NOTE — Progress Notes (Addendum)
Labor Progress Note Rachel Mendez is a 36 y.o. G2P1001 at [redacted]w[redacted]d presented for IOL due to polyhydramnios with A2GDM.  S:   O:  BP 137/78   Pulse 79   Temp 97.9 F (36.6 C) (Oral)   Resp 18   Ht 5' 5.5" (1.664 m)   Wt 129.1 kg   LMP 01/25/2021   SpO2 98%   BMI 46.66 kg/m  EFM: 140/moderate variability/(+) acels, decelerations absent.   CVE: Dilation: 2.5 Effacement (%): Thick Cervical Position: Posterior Station: Ballotable Presentation: Vertex Exam by:: Dr. Miquel Dunn   A&P: 36 y.o. G2P1001 [redacted]w[redacted]d admitted to L&D for IOL due to polyhydramnios #Labor: Progressing well. FB removed this morning and found to be in posterior fornix. Patient's cervical exam had remained unchanged. Pitocin stopped to allow patient to eat and rest. Therapeutic epidural provided to patient. New FB placed and pitocin restarted 2x2.  #Pain: Epidural in place #FWB: Cat I tracing #GBS negative  #A1GDM- BG appropriate.  #gHTN- pre-E labs negative for SF, urine PCR pending. Blood pressures remain mild range and patient asymptomatic. Continue to monitor closely.  Billey Co, MD Resident Physician 1:01 PM

## 2021-11-12 NOTE — Anesthesia Preprocedure Evaluation (Signed)
Anesthesia Evaluation  Patient identified by MRN, date of birth, ID band Patient awake    Reviewed: Allergy & Precautions, Patient's Chart, lab work & pertinent test results  History of Anesthesia Complications Negative for: history of anesthetic complications  Airway Mallampati: II  TM Distance: >3 FB Neck ROM: Full    Dental  (+) Poor Dentition, Missing,    Pulmonary asthma ,    Pulmonary exam normal        Cardiovascular negative cardio ROS Normal cardiovascular exam     Neuro/Psych Anxiety Depression negative neurological ROS     GI/Hepatic negative GI ROS, Neg liver ROS,   Endo/Other  diabetes, GestationalHyperthyroidism   Renal/GU negative Renal ROS  negative genitourinary   Musculoskeletal negative musculoskeletal ROS (+)   Abdominal   Peds  Hematology negative hematology ROS (+)   Anesthesia Other Findings Day of surgery medications reviewed with patient.  Reproductive/Obstetrics (+) Pregnancy (Hx of C/S x1)                             Anesthesia Physical Anesthesia Plan  ASA: 3  Anesthesia Plan: Epidural   Post-op Pain Management:    Induction:   PONV Risk Score and Plan: Treatment may vary due to age or medical condition  Airway Management Planned: Natural Airway  Additional Equipment: Fetal Monitoring  Intra-op Plan:   Post-operative Plan:   Informed Consent: I have reviewed the patients History and Physical, chart, labs and discussed the procedure including the risks, benefits and alternatives for the proposed anesthesia with the patient or authorized representative who has indicated his/her understanding and acceptance.       Plan Discussed with:   Anesthesia Plan Comments:         Anesthesia Quick Evaluation

## 2021-11-12 NOTE — Anesthesia Procedure Notes (Signed)
Epidural Patient location during procedure: OB Start time: 11/12/2021 11:22 AM End time: 11/12/2021 11:25 AM  Staffing Anesthesiologist: Kaylyn Layer, MD Performed: anesthesiologist   Preanesthetic Checklist Completed: patient identified, IV checked, risks and benefits discussed, monitors and equipment checked, pre-op evaluation and timeout performed  Epidural Patient position: sitting Prep: DuraPrep and site prepped and draped Patient monitoring: continuous pulse ox, blood pressure and heart rate Approach: midline Location: L3-L4 Injection technique: LOR air  Needle:  Needle type: Tuohy  Needle gauge: 17 G Needle length: 9 cm Needle insertion depth: 6 cm Catheter type: closed end flexible Catheter size: 19 Gauge Catheter at skin depth: 11 cm Test dose: negative and Other (1% lidocaine)  Assessment Events: blood not aspirated, injection not painful, no injection resistance, no paresthesia and negative IV test  Additional Notes Patient identified. Risks, benefits, and alternatives discussed with patient including but not limited to bleeding, infection, nerve damage, paralysis, failed block, incomplete pain control, headache, blood pressure changes, nausea, vomiting, reactions to medication, itching, and postpartum back pain. Confirmed with bedside nurse the patient's most recent platelet count. Confirmed with patient that they are not currently taking any anticoagulation, have any bleeding history, or any family history of bleeding disorders. Patient expressed understanding and wished to proceed. All questions were answered. Sterile technique was used throughout the entire procedure. Please see nursing notes for vital signs.   Crisp LOR on first pass. Test dose was given through epidural catheter and negative prior to continuing to dose epidural or start infusion. Warning signs of high block given to the patient including shortness of breath, tingling/numbness in hands, complete  motor block, or any concerning symptoms with instructions to call for help. Patient was given instructions on fall risk and not to get out of bed. All questions and concerns addressed with instructions to call with any issues or inadequate analgesia.  Reason for block:procedure for pain

## 2021-11-13 ENCOUNTER — Encounter (HOSPITAL_COMMUNITY): Payer: Self-pay | Admitting: Obstetrics and Gynecology

## 2021-11-13 ENCOUNTER — Encounter: Payer: Medicaid Other | Admitting: Obstetrics & Gynecology

## 2021-11-13 DIAGNOSIS — O134 Gestational [pregnancy-induced] hypertension without significant proteinuria, complicating childbirth: Secondary | ICD-10-CM

## 2021-11-13 DIAGNOSIS — O99284 Endocrine, nutritional and metabolic diseases complicating childbirth: Secondary | ICD-10-CM

## 2021-11-13 DIAGNOSIS — O34211 Maternal care for low transverse scar from previous cesarean delivery: Secondary | ICD-10-CM

## 2021-11-13 DIAGNOSIS — Z3A37 37 weeks gestation of pregnancy: Secondary | ICD-10-CM

## 2021-11-13 DIAGNOSIS — O24420 Gestational diabetes mellitus in childbirth, diet controlled: Secondary | ICD-10-CM

## 2021-11-13 DIAGNOSIS — O34219 Maternal care for unspecified type scar from previous cesarean delivery: Secondary | ICD-10-CM | POA: Diagnosis not present

## 2021-11-13 DIAGNOSIS — O403XX0 Polyhydramnios, third trimester, not applicable or unspecified: Secondary | ICD-10-CM

## 2021-11-13 LAB — GLUCOSE, CAPILLARY
Glucose-Capillary: 82 mg/dL (ref 70–99)
Glucose-Capillary: 87 mg/dL (ref 70–99)
Glucose-Capillary: 89 mg/dL (ref 70–99)
Glucose-Capillary: 108 mg/dL — ABNORMAL HIGH (ref 70–99)

## 2021-11-13 LAB — T3: T3, Total: 344 ng/dL — ABNORMAL HIGH (ref 71–180)

## 2021-11-13 MED ORDER — ONDANSETRON HCL 4 MG PO TABS: 4.0000 mg | ORAL_TABLET | ORAL | Status: DC | PRN

## 2021-11-13 MED ORDER — ZOLPIDEM TARTRATE 5 MG PO TABS: 5.0000 mg | ORAL_TABLET | Freq: Every evening | ORAL | Status: DC | PRN

## 2021-11-13 MED ORDER — COCONUT OIL OIL: 1.0000 | TOPICAL_OIL | Status: DC | PRN

## 2021-11-13 MED ORDER — FENTANYL CITRATE (PF) 100 MCG/2ML IJ SOLN
INTRAMUSCULAR | Status: DC | PRN
  Administered 2021-11-13: 100 ug via EPIDURAL

## 2021-11-13 MED ORDER — TRANEXAMIC ACID-NACL 1000-0.7 MG/100ML-% IV SOLN
INTRAVENOUS | Status: AC
  Filled 2021-11-13: qty 100

## 2021-11-13 MED ORDER — SIMETHICONE 80 MG PO CHEW: 80.0000 mg | CHEWABLE_TABLET | ORAL | Status: DC | PRN

## 2021-11-13 MED ORDER — WITCH HAZEL-GLYCERIN EX PADS: 1.0000 | MEDICATED_PAD | CUTANEOUS | Status: DC | PRN

## 2021-11-13 MED ORDER — PRENATAL MULTIVITAMIN CH
1.0000 | ORAL_TABLET | Freq: Every day | ORAL | Status: DC
  Administered 2021-11-14: 1 via ORAL
  Filled 2021-11-13: qty 1

## 2021-11-13 MED ORDER — ACETAMINOPHEN 325 MG PO TABS
650.0000 mg | ORAL_TABLET | ORAL | Status: DC | PRN
  Administered 2021-11-13 – 2021-11-14 (×3): 650 mg via ORAL
  Filled 2021-11-13 (×3): qty 2

## 2021-11-13 MED ORDER — BUPIVACAINE HCL (PF) 0.25 % IJ SOLN
INTRAMUSCULAR | Status: DC | PRN
  Administered 2021-11-13: 8 mL via EPIDURAL

## 2021-11-13 MED ORDER — TETANUS-DIPHTH-ACELL PERTUSSIS 5-2.5-18.5 LF-MCG/0.5 IM SUSY: 0.5000 mL | PREFILLED_SYRINGE | Freq: Once | INTRAMUSCULAR | Status: DC

## 2021-11-13 MED ORDER — DIPHENHYDRAMINE HCL 25 MG PO CAPS: 25.0000 mg | ORAL_CAPSULE | Freq: Four times a day (QID) | ORAL | Status: DC | PRN

## 2021-11-13 MED ORDER — BENZOCAINE-MENTHOL 20-0.5 % EX AERO
1.0000 | INHALATION_SPRAY | CUTANEOUS | Status: DC | PRN
  Filled 2021-11-13: qty 56

## 2021-11-13 MED ORDER — LACTATED RINGERS AMNIOINFUSION: INTRAVENOUS | Status: DC

## 2021-11-13 MED ORDER — DIBUCAINE (PERIANAL) 1 % EX OINT: 1.0000 | TOPICAL_OINTMENT | CUTANEOUS | Status: DC | PRN

## 2021-11-13 MED ORDER — IBUPROFEN 600 MG PO TABS
600.0000 mg | ORAL_TABLET | Freq: Four times a day (QID) | ORAL | Status: DC
  Administered 2021-11-13 – 2021-11-14 (×5): 600 mg via ORAL
  Filled 2021-11-13 (×6): qty 1

## 2021-11-13 MED ORDER — TRANEXAMIC ACID-NACL 1000-0.7 MG/100ML-% IV SOLN
1000.0000 mg | INTRAVENOUS | Status: AC
  Administered 2021-11-13: 1000 mg via INTRAVENOUS

## 2021-11-13 MED ORDER — ONDANSETRON HCL 4 MG/2ML IJ SOLN: 4.0000 mg | INTRAMUSCULAR | Status: DC | PRN

## 2021-11-13 MED ORDER — SERTRALINE HCL 50 MG PO TABS
50.0000 mg | ORAL_TABLET | Freq: Every day | ORAL | Status: DC
  Administered 2021-11-14 – 2021-11-15 (×2): 50 mg via ORAL
  Filled 2021-11-13 (×2): qty 1

## 2021-11-13 MED ORDER — FENTANYL CITRATE (PF) 100 MCG/2ML IJ SOLN
INTRAMUSCULAR | Status: AC
  Filled 2021-11-13: qty 2

## 2021-11-13 MED ORDER — SENNOSIDES-DOCUSATE SODIUM 8.6-50 MG PO TABS
2.0000 | ORAL_TABLET | Freq: Every day | ORAL | Status: DC
  Administered 2021-11-14: 2 via ORAL
  Filled 2021-11-13: qty 2

## 2021-11-13 MED ORDER — PROPYLTHIOURACIL 50 MG PO TABS
100.0000 mg | ORAL_TABLET | Freq: Two times a day (BID) | ORAL | Status: DC
  Administered 2021-11-13 – 2021-11-14 (×3): 100 mg via ORAL
  Filled 2021-11-13 (×4): qty 2

## 2021-11-13 NOTE — Progress Notes (Signed)
Labor Progress Note Rachel Mendez is a 36 y.o. G2P1001 at [redacted]w[redacted]d presented for IOL polyhydramnios  S: patient doing well. Noted to be same 5cm/70%/-1 on last check, cervix swollen on the R side. Contractions were barely adequate ~ . Pitocin increased to 22 units and then noted to start having variable decels. Amnioinfusion was started. She now has some late decels after a recent position change. Contractions now ~  O:  BP 139/74   Pulse 93   Temp 97.7 F (36.5 C) (Oral)   Resp 18   Ht 5' 5.5" (1.664 m)   Wt 129.1 kg   LMP 01/25/2021   SpO2 98%   BMI 46.66 kg/m  EFM: 140 bpm, moderate variability, +accels, recurrent late decels,   CVE: Dilation: 5 Effacement (%): 70 Cervical Position: Posterior Station: -1 Presentation: Vertex Exam by:: Dr. Ladon Applebaum  A&P: 36 y.o. G2P1001 [redacted]w[redacted]d IOL as above #Labor: Progressing well. AROM @ 2115 9/11.  IUPC and FSE placed at 0203 9/12  - continue amnioinfusion  - cut back pitocin to 21 units  - continue monitoring. #Pain: Epidural in place #FWB: CAT 2 due to recurrent late decels #GBS negative #A1GDM - CBG well contolled, ctm #gHTN - Recent BP controlled, continue to monitor, patient asymptomatic. #Grave's disease - BID PTU  Sheppard Evens MD MPH OB Fellow, Faculty Practice [facility], Center for [facility] 11/13/2021

## 2021-11-13 NOTE — Anesthesia Postprocedure Evaluation (Signed)
Anesthesia Post Note  Patient: Rachel Mendez  Procedure(s) Performed: AN AD HOC LABOR EPIDURAL     Patient location during evaluation: Mother Baby Anesthesia Type: Epidural Level of consciousness: awake and alert Pain management: pain level controlled Vital Signs Assessment: post-procedure vital signs reviewed and stable Respiratory status: spontaneous breathing, nonlabored ventilation and respiratory function stable Cardiovascular status: stable Postop Assessment: no headache, no backache and epidural receding Anesthetic complications: no   No notable events documented.  Last Vitals:  Vitals:   11/13/21 1946 11/13/21 2042  BP: 130/62 130/73  Pulse: 68 (!) 106  Resp: 16 18  Temp: 36.9 C 36.8 C  SpO2:  97%    Last Pain:  Vitals:   11/13/21 2042  TempSrc: Oral  PainSc: 3                  Antonae Zbikowski

## 2021-11-13 NOTE — Progress Notes (Signed)
Labor Progress Note Rachel Mendez is a 36 y.o. G2P1001 at [redacted]w[redacted]d presented for IOL polyhydramnios  S: doing well. Feeling pain on her L side with contractions, despite epidural bolus. Baby having intermittent variable and late decels. Amnioinfusion in place. Slow progress so far, at 5cm for the last 6hrs.  O:  BP (!) 146/87   Pulse 92   Temp 98.3 F (36.8 C) (Oral)   Resp 16   Ht 5' 5.5" (1.664 m)   Wt 129.1 kg   LMP 01/25/2021   SpO2 98%   BMI 46.66 kg/m  EFM: 140 bpm, moderate variability, +accels, recurrent variable decels,   CVE: Dilation: 8.5 Effacement (%): 90 Cervical Position: Posterior Station: -1, 0 Presentation: Vertex Exam by:: Albertine Grates RN  A&P: 36 y.o. G2P1001 [redacted]w[redacted]d IOL as above #Labor: Making slow progress. Now in active phase. We were concerned about initial slow progress, but patient wants to really avoid a C-section as much as possibly. Recent change to 8.5cm is reassuring. Will continue current plan. Re-eval over the next 2 hrs. AROM @ 2115 9/11.  IUPC and FSE placed at 0203 9/12  - continue amnioinfusion  - continue pitocin @ 21 units  - continue monitoring. #Pain: Epidural in place #FWB: CAT 2 due to recurrent decels. #GBS negative #A1GDM - CBG well contolled, ctm #gHTN - Recent BP controlled, continue to monitor, patient asymptomatic. #Grave's disease uncontrolled.- continue BID PTU  Sheppard Evens MD MPH OB Fellow, Faculty Practice [facility], Center for [facility] 11/13/2021

## 2021-11-13 NOTE — Progress Notes (Signed)
Labor Progress Note Rachel Mendez is a 36 y.o. G2P1001 at [redacted]w[redacted]d presented for IOL due to polyhydramnios S: Patient is resting comfortably, sleeping.  O:  BP (!) 96/43   Pulse 69   Temp 98.2 F (36.8 C) (Oral)   Resp 16   Ht 5' 5.5" (1.664 m)   Wt 129.1 kg   LMP 01/25/2021   SpO2 98%   BMI 46.66 kg/m  EFM: 120/moderate variability/accels present  CVE: Dilation: 6 Effacement (%): 50 Cervical Position: Posterior Station: -3 Presentation: Vertex Exam by:: Dr. Lanae Crumbly   A&P: 36 y.o. G2P1001 [redacted]w[redacted]d here for IOL due to polyhydramnios #Labor: Progressing slowly. Increase pit to 20. Will rest pitocin after 3 hours if there has been change at next cervical exam.  #Pain: Epidural placed. #FWB: Cat 1 #GBS negative #A1GDM - CBG appropriate #gHTN - Recent BP controlled, continue to monitor, patient asymptomatic. #Grave's disease - BID PTU  [name], MD, PGY-1 [facility] 1:46 AM 11/13/2021

## 2021-11-13 NOTE — Progress Notes (Signed)
Labor Progress Note Rachel Mendez is a 36 y.o. G2P1001 at [redacted]w[redacted]d presented for IOL poly  S: Pt contractions are not adequately paced  O:  BP (!) 114/52   Pulse 75   Temp 98.2 F (36.8 C) (Oral)   Resp 16   Ht 5' 5.5" (1.664 m)   Wt 129.1 kg   LMP 01/25/2021   SpO2 98%   BMI 46.66 kg/m  EFM:115 bpm/Moderate variability/ 15x15 accels/ None decels  CVE: Dilation: 5 Effacement (%): 60 Cervical Position: Posterior Station: -3 Presentation: Vertex Exam by:: Dr. Lanae Crumbly   A&P: 36 y.o. G2P1001 [redacted]w[redacted]d Iol as above #Labor: Progressing well. IUPC and FSE placed at 0203 #Pain: Epidural #FWB: CAT 1 #GBS negative #A1GDM - CBG well contolled, ctm #gHTN - Recent BP controlled, continue to monitor, patient asymptomatic. #Grave's disease - BID PTU  Rachel Kram Q Mercado-Ortiz, DO 3:01 AM

## 2021-11-13 NOTE — Discharge Summary (Cosign Needed Addendum)
Postpartum Discharge Summary      Patient Name: Rachel Mendez DOB: 07-26-1985 MRN: [id number]  Date of admission: 11/11/2021 Delivery date:11/13/2021  Delivering provider: Apolonio Schneiders  Date of discharge: 11/15/2021  Admitting diagnosis: GDM, class A1 [O24.410] Intrauterine pregnancy: [redacted]w[redacted]d     Secondary diagnosis:  Principal Problem:   VBAC, delivered, current hospitalization Active Problems:   Previous cesarean section   Graves disease   Gestational diabetes mellitus (GDM), antepartum   Polyhydramnios affecting pregnancy in third trimester   BMI 45.0-49.9, adult ([place])   GDM, class A1   Gestational hypertension  Additional problems: N/A    Discharge diagnosis: Term Pregnancy Delivered, VBAC, Gestational Hypertension, and GDM A1                                              Post partum procedures: N/A Augmentation: AROM, Pitocin, and IP Foley Complications: None  Hospital course: Induction of Labor With Vaginal Delivery   36 y.o. yo G2P1001 at [redacted]w[redacted]d was admitted to the hospital 11/11/2021 for induction of labor.  Indication for induction: Gestational hypertension, A1 DM, and polyhydramnios .  Patient had an uncomplicated labor course as follows: Membrane Rupture Time/Date: 9:14 PM ,11/12/2021   Delivery Method:VBAC, Spontaneous  Episiotomy: None  Lacerations:  2nd degree  Details of delivery can be found in separate delivery note.  Patient had a routine postpartum course with the addition of Lasix $Remove'20mg'BylgXCW$  x 5d; no antihypertensive required as BPs were normal to very slightly elevated PP. Patient is discharged home 11/15/21.  Newborn Data: Birth date:11/13/2021  Birth time:6:36 PM  Gender:Female  Living status:Living  Apgars:8 ,9  Weight:3370 g   Magnesium Sulfate received: No BMZ received: No Rhophylac:N/A MMR:n/a. Rubella immune T-DaP:Given prenatally Flu: N/A Transfusion:No  Physical exam  Vitals:   11/14/21 0540 11/14/21 0835 11/14/21 2014 11/15/21 0516   BP: 102/68 128/73 131/71 129/81  Pulse: 63 (!) 58 67 66  Resp: $Remo'16 18 17 16  'Sigmo$ Temp: 97.8 F (36.6 C) 97.8 F (36.6 C) 98.2 F (36.8 C) 97.6 F (36.4 C)  TempSrc: Axillary Oral Oral Oral  SpO2: 98% 99%    Weight:      Height:       General: alert, cooperative, and no distress Lochia: appropriate Uterine Fundus: firm Incision: N/A DVT Evaluation: No evidence of DVT seen on physical exam. No significant calf/ankle edema. Labs: Lab Results  Component Value Date   WBC 12.6 (H) 11/14/2021   HGB 9.7 (L) 11/14/2021   HCT 29.9 (L) 11/14/2021   MCV 77.5 (L) 11/14/2021   PLT 222 11/14/2021      Latest Ref Rng & Units 11/12/2021    9:57 AM  CMP  Glucose 70 - 99 mg/dL 89   BUN 6 - 20 mg/dL <5   Creatinine 0.44 - 1.00 mg/dL 0.52   Sodium 135 - 145 mmol/L 134   Potassium 3.5 - 5.1 mmol/L 3.6   Chloride 98 - 111 mmol/L 106   CO2 22 - 32 mmol/L 20   Calcium 8.9 - 10.3 mg/dL 8.8   Total Protein 6.5 - 8.1 g/dL 5.9   Total Bilirubin 0.3 - 1.2 mg/dL 0.8   Alkaline Phos 38 - 126 U/L 136   AST 15 - 41 U/L 11   ALT 0 - 44 U/L 12    Edinburgh Score:    11/14/2021  2:30 PM  Edinburgh Postnatal Depression Scale Screening Tool  I have been able to laugh and see the funny side of things. 0  I have looked forward with enjoyment to things. 0  I have blamed myself unnecessarily when things went wrong. 2  I have been anxious or worried for no good reason. 1  I have felt scared or panicky for no good reason. 1  Things have been getting on top of me. 1  I have been so unhappy that I have had difficulty sleeping. 0  I have felt sad or miserable. 0  I have been so unhappy that I have been crying. 1  The thought of harming myself has occurred to me. 0  Edinburgh Postnatal Depression Scale Total 6     After visit meds:  Allergies as of 11/15/2021       Reactions   Bee Venom Anaphylaxis   Latex Hives        Medication List     STOP taking these medications    Accu-Chek Guide  test strip Generic drug: glucose blood   Accu-Chek Guide w/Device Kit   Accu-Chek Softclix Lancets lancets   albuterol 0.63 MG/3ML nebulizer solution Commonly known as: ACCUNEB   Ventolin HFA 108 (90 Base) MCG/ACT inhaler Generic drug: albuterol       TAKE these medications    acetaminophen 325 MG tablet Commonly known as: Tylenol Take 2 tablets (650 mg total) by mouth every 6 (six) hours as needed.   benzocaine-Menthol 20-0.5 % Aero Commonly known as: DERMOPLAST Apply 1 Application topically as needed for irritation (perineal discomfort).   dibucaine 1 % Oint Commonly known as: NUPERCAINAL Place 1 Application rectally as needed for hemorrhoids.   furosemide 20 MG tablet Commonly known as: LASIX Take 1 tablet (20 mg total) by mouth daily for 5 days.   ibuprofen 600 MG tablet Commonly known as: ADVIL Take 1 tablet (600 mg total) by mouth every 6 (six) hours.   pantoprazole 40 MG tablet Commonly known as: Protonix Take 1 tablet (40 mg total) by mouth daily.   prenatal multivitamin Tabs tablet Take 1 tablet by mouth daily at 12 noon.   propylthiouracil 50 MG tablet Commonly known as: PTU Take 2 tablets (100 mg total) by mouth 2 (two) times daily.   senna-docusate 8.6-50 MG tablet Commonly known as: Senokot-S Take 2 tablets by mouth daily.   sertraline 50 MG tablet Commonly known as: ZOLOFT Take 50 mg by mouth daily.         Discharge home in stable condition Infant Feeding: Bottle Infant Disposition:home with mother Discharge instruction: per After Visit Summary and Postpartum booklet. Activity: Advance as tolerated. Pelvic rest for 6 weeks.  Diet: routine diet Future Appointments: Future Appointments  Date Time Provider Wise  11/21/2021 10:45 AM [facility] NURSE [facility]  12/12/2021  8:15 AM [facility] LAB [facility]  12/12/2021  9:35 AM Donnamae Jude, MD [facility]   Follow up Visit:  The following  message was sent to [facility] by Mikki Santee, MD Please schedule this patient for a In person postpartum visit in 4 weeks with the following provider: MD. Additional Postpartum F/U:BP check 1 week  High risk pregnancy complicated by: GDM, HTN, and polyhydramios, previous C-section,  Delivery mode:  VBAC, Spontaneous  Anticipated Birth Control:  Garvin Fila, CNM 11/15/2021 1:57 PM

## 2021-11-14 LAB — CBC
HCT: 29.9 % — ABNORMAL LOW (ref 36.0–46.0)
Hemoglobin: 9.7 g/dL — ABNORMAL LOW (ref 12.0–15.0)
MCH: 25.1 pg — ABNORMAL LOW (ref 26.0–34.0)
MCHC: 32.4 g/dL (ref 30.0–36.0)
MCV: 77.5 fL — ABNORMAL LOW (ref 80.0–100.0)
Platelets: 222 10*3/uL (ref 150–400)
RBC: 3.86 MIL/uL — ABNORMAL LOW (ref 3.87–5.11)
RDW: 14.6 % (ref 11.5–15.5)
WBC: 12.6 10*3/uL — ABNORMAL HIGH (ref 4.0–10.5)
nRBC: 0 % (ref 0.0–0.2)

## 2021-11-14 MED ORDER — FERROUS SULFATE 325 (65 FE) MG PO TABS
325.0000 mg | ORAL_TABLET | ORAL | Status: DC
  Administered 2021-11-14: 325 mg via ORAL
  Filled 2021-11-14: qty 1

## 2021-11-14 NOTE — Social Work (Signed)
MOB was referred for history of depression/anxiety.  * Referral screened out by Clinical Social Worker because none of the following criteria appear to apply:  ~ History of anxiety/depression during this pregnancy, or of post-partum depression following prior delivery.  ~ Diagnosis of anxiety and/or depression within last 3 years OR * MOB's symptoms currently being treated with medication and/or therapy.  Per chart review MOB was diagnosed with depression prior to Sept 2020, MOB has an active prescription for Zoloft.  Please contact the Clinical Social Worker if needs arise, by [facility] request, or if MOB scores greater than 9/yes to question 10 on Edinburgh Postpartum Depression Screen.   Wende Neighbors, LCSWA Clinical Social Worker [phone]

## 2021-11-14 NOTE — Progress Notes (Addendum)
Post Partum Day 1  Subjective: Rachel Mendez is a 36 y.o. Y1O1751 s/p SVD at [redacted]w[redacted]d.  She reports she is doing well. No acute events overnight. She denies any problems with voiding or po intake. She has not yet ambulated independently yet. Still recovering from epidural but is able to walk to bathroom with assistance. Denies nausea or vomiting. She has passed flatus. She has not had bowel movement. Pain is well controlled.  Lochia is Minimal.  Denies headache, scotomata, RUQ pain, dizziness, dyspnea, and chest palpitation  Objective: Blood pressure 102/68, pulse 63, temperature 97.8 F (36.6 C), temperature source Axillary, resp. rate 16, height 5' 5.5" (1.664 m), weight 129.1 kg, last menstrual period 01/25/2021, SpO2 98 %, unknown if currently breastfeeding.  Physical Exam:  General: alert, cooperative, and no distress Lochia: appropriate Uterine Fundus: firm DVT Evaluation: No evidence of DVT seen on physical exam.  Recent Labs    11/12/21 0957 11/14/21 0552  HGB 11.0* 9.7*  HCT 34.7* 29.9*    Assessment/Plan: PPD#1: Doing well, pain well-controlled.  -- Routine postpartum care, lactation support -- Encouraged up OOB -- Contraception:  undecided  Unsure -- Feeding: bottle feeding -- Circumcision: N/A -- Anemia: hgb 9.7. Asymptomatic, start oral iron -- A1GDM - CBG well controlled: continue to monitor -- gHTN - Recent BP controlled: continue to monitor, patient asymptomatic. -- Grave's disease uncontrolled: continue BID PTU  Dispo: Plan for discharge tomorrow.   LOS: 3 days   Annette Stable, Medical Student 11/14/2021, 7:04 AM   Sheppard Evens MD MPH OB Fellow, Faculty [facility], Center for [facility] 11/14/2021

## 2021-11-15 ENCOUNTER — Ambulatory Visit: Payer: Medicaid Other

## 2021-11-15 DIAGNOSIS — O139 Gestational [pregnancy-induced] hypertension without significant proteinuria, unspecified trimester: Secondary | ICD-10-CM | POA: Diagnosis not present

## 2021-11-15 MED ORDER — FUROSEMIDE 20 MG PO TABS: 20.0000 mg | ORAL_TABLET | Freq: Every day | ORAL | 0 refills | Status: DC

## 2021-11-15 MED ORDER — DIBUCAINE (PERIANAL) 1 % EX OINT: 1.0000 | TOPICAL_OINTMENT | CUTANEOUS | 0 refills | Status: DC | PRN

## 2021-11-15 MED ORDER — FUROSEMIDE 20 MG PO TABS
20.0000 mg | ORAL_TABLET | Freq: Every day | ORAL | Status: DC
  Administered 2021-11-15: 20 mg via ORAL
  Filled 2021-11-15: qty 1

## 2021-11-15 MED ORDER — IBUPROFEN 600 MG PO TABS: 600.0000 mg | ORAL_TABLET | Freq: Four times a day (QID) | ORAL | 0 refills | Status: DC

## 2021-11-15 MED ORDER — ACETAMINOPHEN 325 MG PO TABS: 650.0000 mg | ORAL_TABLET | Freq: Four times a day (QID) | ORAL | 0 refills | Status: DC | PRN

## 2021-11-15 MED ORDER — SENNOSIDES-DOCUSATE SODIUM 8.6-50 MG PO TABS: 2.0000 | ORAL_TABLET | Freq: Every day | ORAL | 0 refills | Status: DC

## 2021-11-15 MED ORDER — BENZOCAINE-MENTHOL 20-0.5 % EX AERO: 1.0000 | INHALATION_SPRAY | CUTANEOUS | 1 refills | Status: DC | PRN

## 2021-11-15 NOTE — Progress Notes (Signed)
Pt stated to this RN that "I have not seen a doctor since I left L&D and I have concerns about the swelling in my leg." Upon arriving to the unit this am at 0700, this RN witnessed Dr. Sudie Bailey go in pt's room twice while on the unit.  During assessment, this RN noticed 1+ edema to BLE. Pt denies calf pain during dorsiflexion  and plantar flexion of feet. No discoloration noted to calf. Pt denies pain calf, leg, or foot pain while walking.  Informed pt the MD is addressing swelling because lasix has been ordered. This RN called to inform Dr. Sudie Bailey of pt's complaints and assessment findings. No further orders received.

## 2021-11-20 ENCOUNTER — Encounter: Payer: Medicaid Other | Admitting: Obstetrics and Gynecology

## 2021-11-21 ENCOUNTER — Ambulatory Visit (INDEPENDENT_AMBULATORY_CARE_PROVIDER_SITE_OTHER): Payer: Medicaid Other | Admitting: Emergency Medicine

## 2021-11-21 DIAGNOSIS — Z0130 Encounter for examination of blood pressure without abnormal findings: Secondary | ICD-10-CM

## 2021-11-21 NOTE — Progress Notes (Signed)
Patient presents for BP check. TOLAC on 11/13/2021.  Denies headache, blurry vision, or swelling. BP 135/78 in office today.  Currently breastfeeding, did not get tubal, requests birth control. Patient advised to schedule provider appointment for birth control consult.   Reports "baby blues" last week but reports improvement with mood. Negative Edinburgh. Reports taking Zoloft.   Requests Lactation assistance.

## 2021-11-21 NOTE — Progress Notes (Signed)
Subjective:  Rachel Mendez is a 36 y.o. female here for BP check.   Hypertension ROS: Patient denies any headaches, visual symptoms, RUQ/epigastric pain or other concerning symptoms.  Objective:  There were no vitals taken for this visit.  Appearance alert, well appearing, and in no distress and oriented to person, place, and time. General exam BP noted to be 135/78 today in office.    Assessment:   Blood Pressure stable.   Plan:  Current treatment plan is effective, no change in therapy.Marland Kitchen

## 2021-11-23 ENCOUNTER — Encounter: Payer: Self-pay | Admitting: Family Medicine

## 2021-11-27 ENCOUNTER — Encounter: Payer: Medicaid Other | Admitting: Obstetrics and Gynecology

## 2021-12-12 ENCOUNTER — Encounter: Payer: Self-pay | Admitting: Family Medicine

## 2021-12-12 ENCOUNTER — Ambulatory Visit: Payer: Medicaid Other | Admitting: Family Medicine

## 2021-12-12 ENCOUNTER — Other Ambulatory Visit: Payer: Medicaid Other

## 2021-12-12 NOTE — Progress Notes (Signed)
Patient did not keep appointment today. She will be called to reschedule.  

## 2022-05-16 ENCOUNTER — Telehealth (INDEPENDENT_AMBULATORY_CARE_PROVIDER_SITE_OTHER): Payer: Medicaid Other | Admitting: Internal Medicine

## 2022-05-16 ENCOUNTER — Encounter: Payer: Self-pay | Admitting: Internal Medicine

## 2022-05-16 VITALS — Ht 65.5 in

## 2022-05-16 DIAGNOSIS — O24439 Gestational diabetes mellitus in the puerperium, unspecified control: Secondary | ICD-10-CM

## 2022-05-16 DIAGNOSIS — E0590 Thyrotoxicosis, unspecified without thyrotoxic crisis or storm: Secondary | ICD-10-CM | POA: Diagnosis not present

## 2022-05-16 DIAGNOSIS — Z8632 Personal history of gestational diabetes: Secondary | ICD-10-CM

## 2022-05-16 DIAGNOSIS — Z8759 Personal history of other complications of pregnancy, childbirth and the puerperium: Secondary | ICD-10-CM | POA: Diagnosis not present

## 2022-05-16 DIAGNOSIS — E0500 Thyrotoxicosis with diffuse goiter without thyrotoxic crisis or storm: Secondary | ICD-10-CM
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
## 2022-05-17 MED ORDER — PROPYLTHIOURACIL 50 MG PO TABS: 100.0000 mg | ORAL_TABLET | Freq: Two times a day (BID) | ORAL | 4 refills | Status: DC

## 2022-05-24 ENCOUNTER — Other Ambulatory Visit: Payer: Medicaid Other

## 2022-06-03 ENCOUNTER — Other Ambulatory Visit (INDEPENDENT_AMBULATORY_CARE_PROVIDER_SITE_OTHER): Payer: Medicaid Other

## 2022-06-03 DIAGNOSIS — Z8632 Personal history of gestational diabetes: Secondary | ICD-10-CM | POA: Diagnosis not present

## 2022-06-03 DIAGNOSIS — E0590 Thyrotoxicosis, unspecified without thyrotoxic crisis or storm: Secondary | ICD-10-CM

## 2022-06-03 DIAGNOSIS — O24439 Gestational diabetes mellitus in the puerperium, unspecified control: Secondary | ICD-10-CM

## 2022-06-03 LAB — T4, FREE: Free T4: 1.2 ng/dL (ref 0.60–1.60)

## 2022-06-03 LAB — HEMOGLOBIN A1C: Hgb A1c MFr Bld: 5.4 % (ref 4.6–6.5)

## 2022-06-03 LAB — TSH: TSH: 0 u[IU]/mL — ABNORMAL LOW (ref 0.35–5.50)

## 2022-06-04 ENCOUNTER — Other Ambulatory Visit: Payer: Self-pay | Admitting: Internal Medicine

## 2022-06-04 DIAGNOSIS — E0590 Thyrotoxicosis, unspecified without thyrotoxic crisis or storm: Secondary | ICD-10-CM

## 2022-06-04 DIAGNOSIS — E0500 Thyrotoxicosis with diffuse goiter without thyrotoxic crisis or storm: Secondary | ICD-10-CM

## 2022-06-04 LAB — T3: T3, Total: 186 ng/dL — ABNORMAL HIGH (ref 76–181)

## 2022-06-04 MED ORDER — PROPYLTHIOURACIL 50 MG PO TABS: 100.0000 mg | ORAL_TABLET | Freq: Two times a day (BID) | ORAL | 6 refills | Status: DC

## 2022-09-11 ENCOUNTER — Other Ambulatory Visit: Payer: Self-pay | Admitting: *Deleted

## 2022-09-11 ENCOUNTER — Telehealth: Payer: Self-pay | Admitting: *Deleted

## 2022-09-11 MED ORDER — FLUCONAZOLE 200 MG PO TABS: 200.0000 mg | ORAL_TABLET | Freq: Every day | ORAL | 1 refills | Status: DC

## 2022-09-11 NOTE — Telephone Encounter (Signed)
-----   Message from Alvin Critchley sent at 09/11/2022  3:32 PM EDT ----- Regarding: Medication Request Pt came in to office requesting a prescription for diflucan for thrush. Her baby has thrush and pediatrician sent meds for baby but not mom.    Pharmacy : [facility] on [address]

## 2022-09-11 NOTE — Telephone Encounter (Signed)
Called pt to let her know we will send in diflucan per protocol

## 2023-02-03 ENCOUNTER — Ambulatory Visit: Payer: Medicaid Other | Admitting: Dietician

## 2023-08-13 IMAGING — US US OB < 14 WEEKS - US OB TV
1 series · 14 of 28 positions shown · non-contrast
Comparison: None.

CLINICAL DATA: Vaginal bleeding

EXAM:
OBSTETRIC <14 WK US AND TRANSVAGINAL OB US
TECHNIQUE: Both transabdominal and transvaginal ultrasound examinations were
performed for complete evaluation of the gestation as well as the
maternal uterus, adnexal regions, and pelvic cul-de-sac.
Transvaginal technique was performed to assess early pregnancy.

[Series 1: us ob less than 14 weeks with ob transvaginal · 14 of 111 slices shown]
[im 5/111]
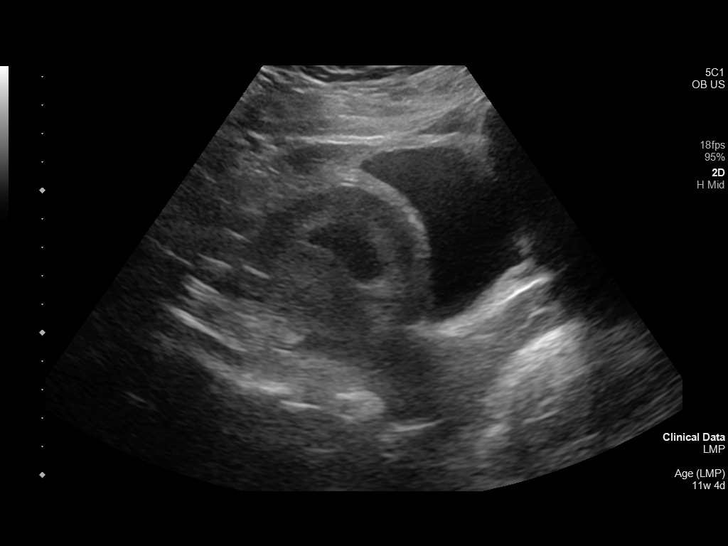
[im 13/111]
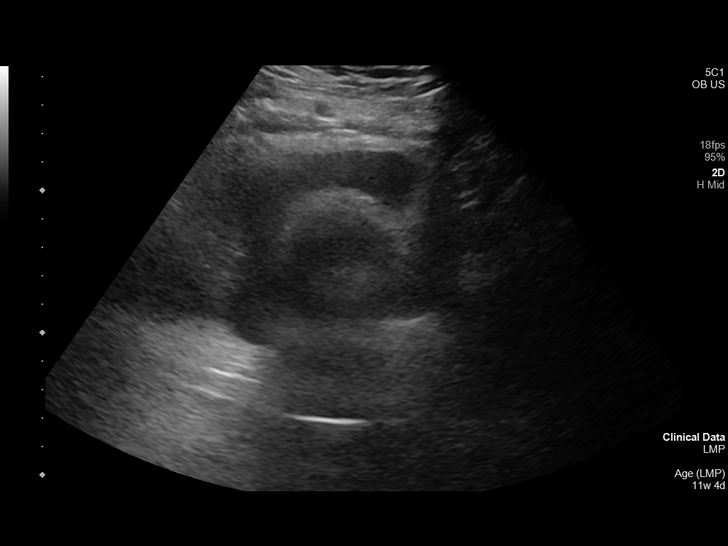
[im 21/111]
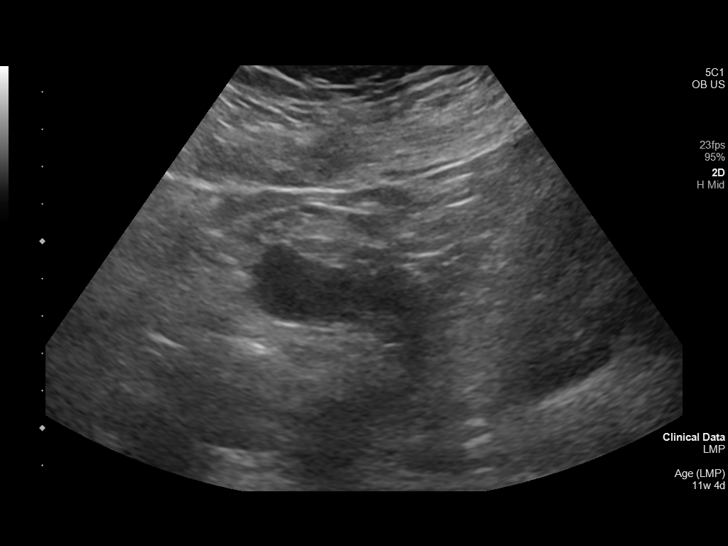
[im 29/111]
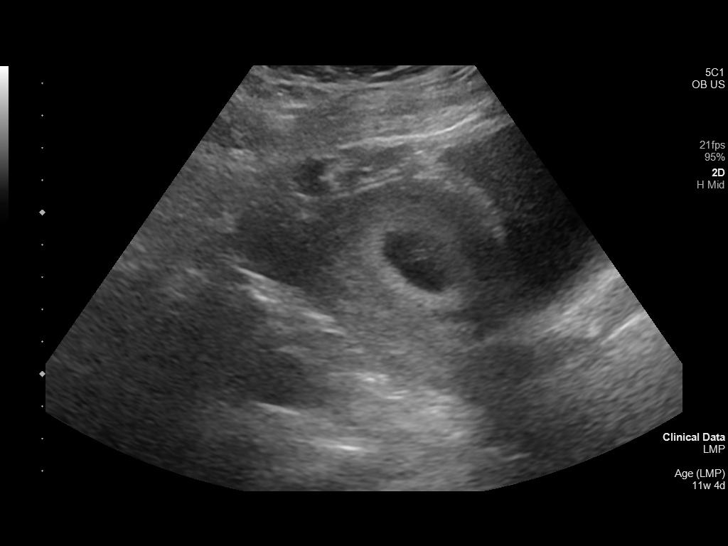
[im 37/111]
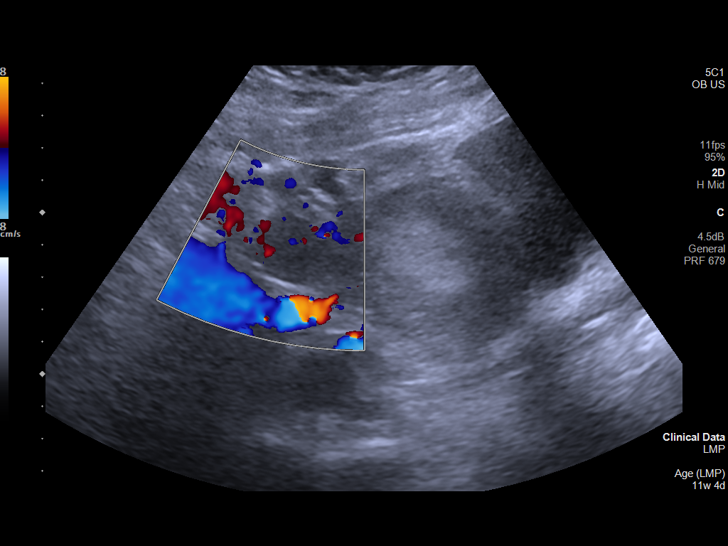
[im 45/111]
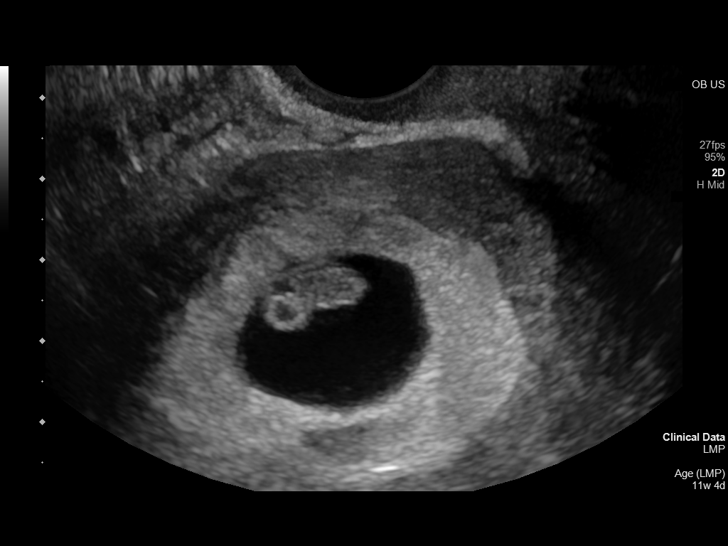
[im 53/111]
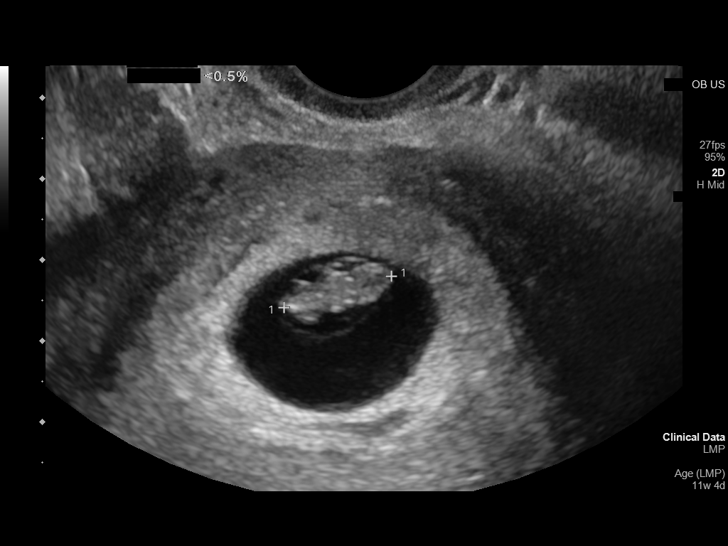
[im 62/111]
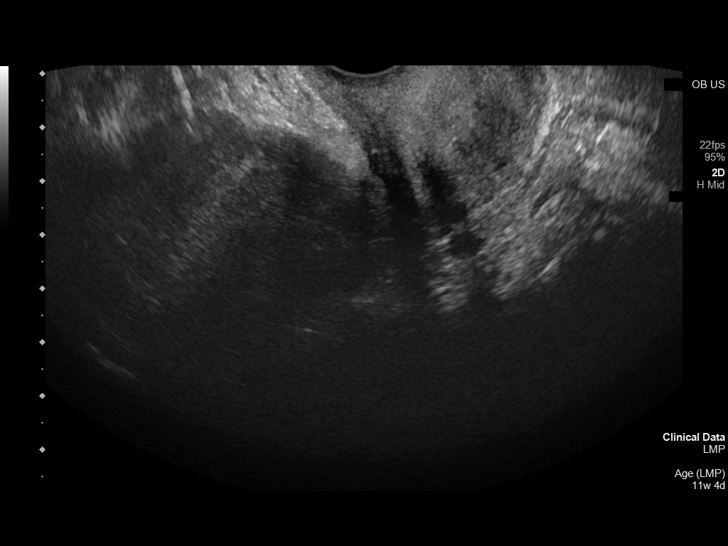
[im 70/111]
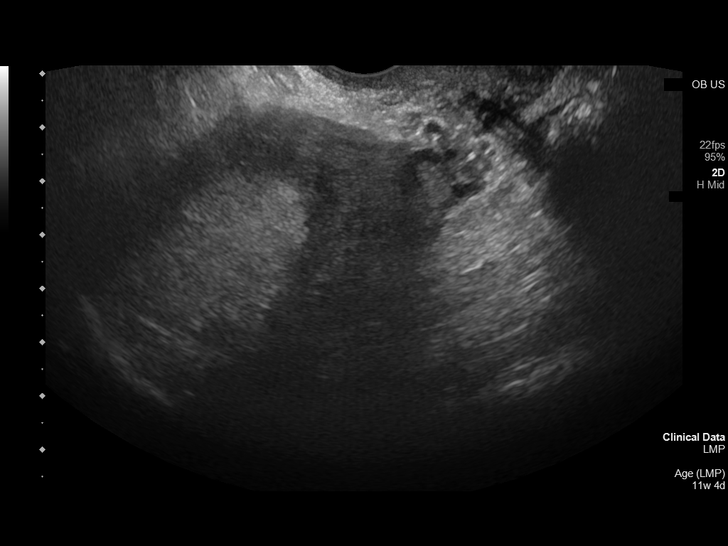
[im 78/111]
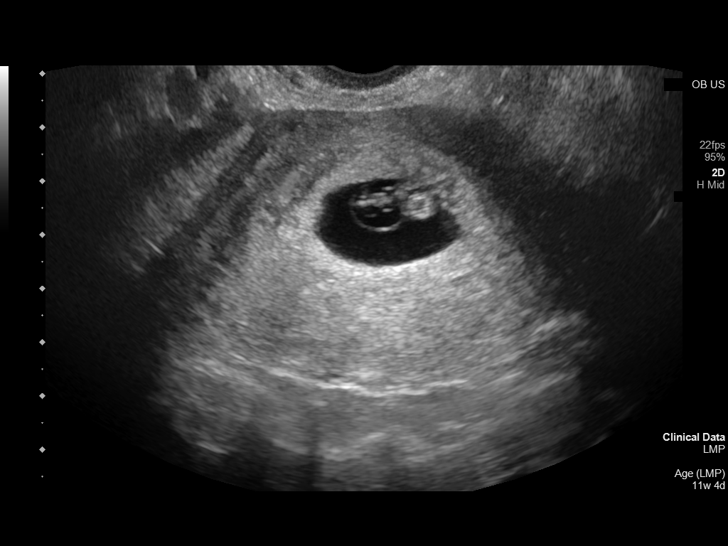
[im 86/111]
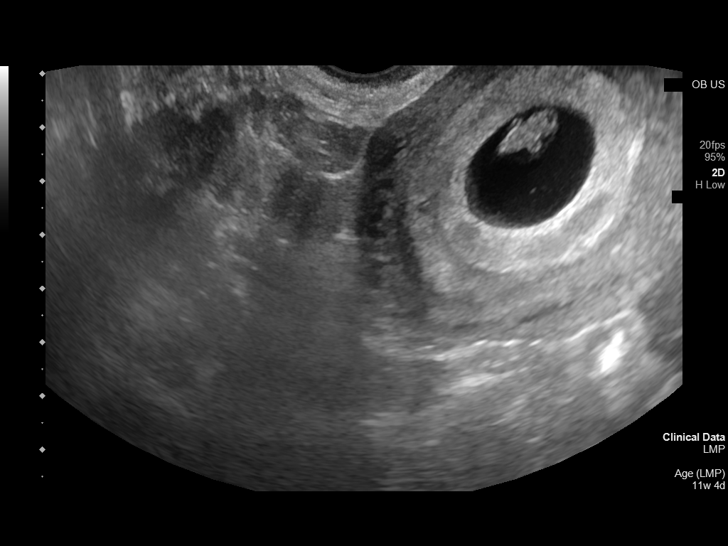
[im 94/111]
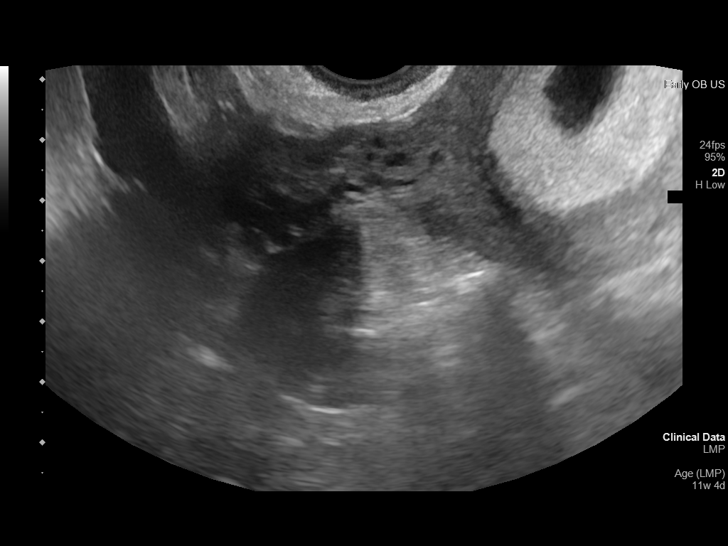
[im 102/111]
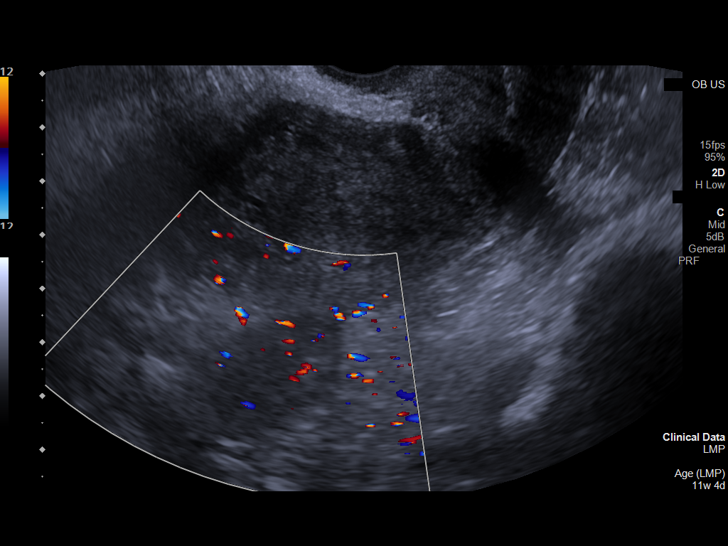
[im 111/111]
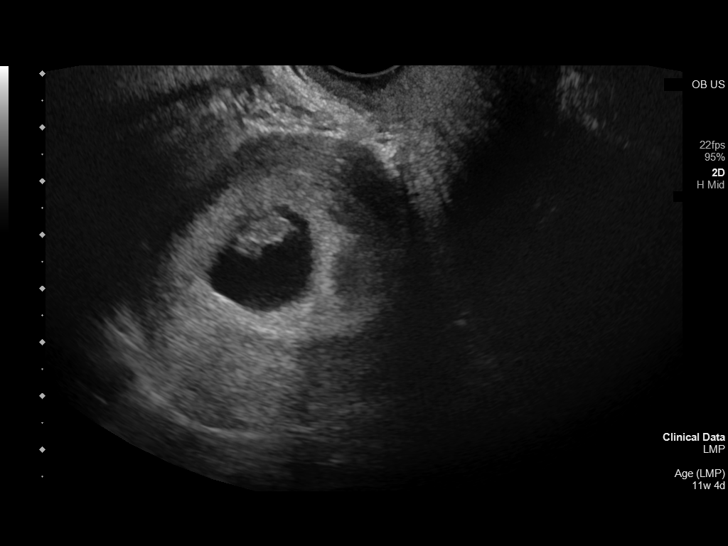

[14 of 28 positions shown; findings below may reference images not displayed]

FINDINGS: Intrauterine gestational sac: Single

Yolk sac:  Visualized.

Embryo:  Visualized.

Cardiac Activity: Visualized.

Heart Rate: 152 bpm

MSD:   mm    w     d

CRL:  13.9 mm   7 w   5 d                  US EDC: 11/28/2021

Subchorionic hemorrhage:  None visualized.

Maternal uterus/adnexae: No adnexal mass or free fluid.
IMPRESSION: Seven week 5 day intrauterine pregnancy. Fetal heart rate 152 beats
per minute. No acute maternal findings.

## 2023-08-30 ENCOUNTER — Other Ambulatory Visit: Payer: Self-pay

## 2023-08-30 ENCOUNTER — Emergency Department
Admission: EM | Admit: 2023-08-30 | Discharge: 2023-08-30 | Disposition: A | Discharge status: Home / Self Care | Attending: Emergency Medicine | Admitting: Emergency Medicine

## 2023-08-30 ENCOUNTER — Emergency Department

## 2023-08-30 DIAGNOSIS — S63501A Unspecified sprain of right wrist, initial encounter: Secondary | ICD-10-CM | POA: Diagnosis not present

## 2023-08-30 DIAGNOSIS — Z91040 Latex allergy status: Secondary | ICD-10-CM | POA: Insufficient documentation

## 2023-08-30 DIAGNOSIS — M79631 Pain in right forearm: Secondary | ICD-10-CM | POA: Insufficient documentation

## 2023-08-30 DIAGNOSIS — Y92410 Unspecified street and highway as the place of occurrence of the external cause: Secondary | ICD-10-CM | POA: Diagnosis not present

## 2023-08-30 DIAGNOSIS — S6991XA Unspecified injury of right wrist, hand and finger(s), initial encounter: Secondary | ICD-10-CM | POA: Diagnosis present

## 2023-08-30 MED ORDER — CYCLOBENZAPRINE HCL 5 MG PO TABS: 5.0000 mg | ORAL_TABLET | Freq: Three times a day (TID) | ORAL | 0 refills | Status: DC | PRN

## 2023-08-30 MED ORDER — MELOXICAM 15 MG PO TABS: 15.0000 mg | ORAL_TABLET | Freq: Every day | ORAL | 0 refills | Status: DC
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
## 2023-08-30 NOTE — Discharge Instructions (Addendum)
 Please rest and ice your right wrist.  Use brace as needed for comfort over the next week.  If continued pain in 1 week follow-up with primary care provider or orthopedics for repeat evaluation.  Take meloxicam daily with food up to 15 days.  Do not take any other Aleve or ibuprofen  with meloxicam.  You may take Tylenol  for additional pain relief if needed with the meloxicam.  Use Flexeril as needed for muscle spasms.
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
## 2023-10-10 ENCOUNTER — Ambulatory Visit: Admitting: Internal Medicine

## 2023-10-10 NOTE — Progress Notes (Deleted)
 Name: Rachel Mendez  MRN/ DOB: [id number], 1985/10/17    Age/ Sex: 38 y.o., female    PCP: [facility]   Reason for Endocrinology Evaluation: Hyperthyroidism     Date of Initial Endocrinology Evaluation: 05/16/2022    HPI: [name] is a 38 y.o. female with a past medical history of Hyperthyroidism. The patient presented for initial endocrinology clinic visit on 05/16/2022 for consultative assistance with her Hyperthyroidism.   She was diagnosed with hyperthyroidism secondary to Graves' Disease in 07/2021 during pregnancy with a suppressed TSH < 0.005 uIU/mL , elevated FT4 at 2.11 ng/dL and elevated total and free t3  She was also noted with elevated Anti-TPO Abs 453 IU/mL    She was started on Methimazole  in 07/2021 but switched to PTU shortly after   During that pregnancy she was also diagnosed with Gestational DM  She is S/P vaginal delivery 11/11/2021   She did not tolerate methimazole  due to nausea  Mother with Hashimoto's thyroiditis  The patient was lost to follow-up after her initial visit from March, 2024 until her return in August, 2025  SUBJECTIVE:    Today (10/10/23):  Rachel Mendez is here for follow-up on Graves' disease.   The patient has not been to our clinic in 17 months    HISTORY:  Past Medical History:  Past Medical History:  Diagnosis Date   Anxiety    Asthma    Depression    Gestational diabetes    Graves disease 07/21/2021   Past Surgical History:  Past Surgical History:  Procedure Laterality Date   CESAREAN SECTION      Social History:  reports that she has never smoked. She has never used smokeless tobacco. She reports that she does not currently use alcohol. She reports that she does not currently use drugs. Family History: family history includes ADD / ADHD in her sister; Anxiety disorder in her mother; Depression in her mother; Diabetes in her paternal grandfather; Epilepsy in her maternal grandmother and sister;  Hashimoto's thyroiditis in her mother.   HOME MEDICATIONS: Allergies as of 10/10/2023       Reactions   Bee Venom Anaphylaxis   Latex Hives        Medication List        Accurate as of October 10, 2023 11:02 AM. If you have any questions, ask your nurse or doctor.          acetaminophen  325 MG tablet Commonly known as: Tylenol  Take 2 tablets (650 mg total) by mouth every 6 (six) hours as needed.   cyclobenzaprine  5 MG tablet Commonly known as: FLEXERIL  Take 1-2 tablets (5-10 mg total) by mouth 3 (three) times daily as needed for muscle spasms.   fluconazole  200 MG tablet Commonly known as: DIFLUCAN  Take 1 tablet (200 mg total) by mouth daily. Take two tablets on first day, then one tablet daily to complete 14 day regimen   furosemide  20 MG tablet Commonly known as: LASIX  Take 1 tablet (20 mg total) by mouth daily for 5 days.   meloxicam  15 MG tablet Commonly known as: MOBIC  Take 1 tablet (15 mg total) by mouth daily.   propylthiouracil  50 MG tablet Commonly known as: PTU Take 2 tablets (100 mg total) by mouth 2 (two) times daily.   sertraline  100 MG tablet Commonly known as: ZOLOFT  Take 100 mg by mouth in the morning and at bedtime.          REVIEW OF SYSTEMS: A comprehensive  ROS was conducted with the patient and is negative except as per HPI    OBJECTIVE:  VS: There were no vitals taken for this visit.   Wt Readings from Last 3 Encounters:  08/30/23 (!) 340 lb (154.2 kg)  11/21/21 270 lb (122.5 kg)  11/11/21 284 lb 11.2 oz (129.1 kg)     EXAM: General: Pt appears well and is in NAD  Eyes: External eye exam normal without stare, lid lag or exophthalmos.  EOM intact.       DATA REVIEWED:     ASSESSMENT/PLAN/RECOMMENDATIONS:   Hyperthyroidism:  -Patient is clinically euthyroid -Tolerating PTU -Patient could not tolerate methimazole  due to nausea -She will stop by next week for TFTs, we discussed the importance of serial follow-ups to  monitor TFTs and prevent hypo or hyper thyroid   Medications : Continue PTU 50 mg, 2 tabs twice daily   2.Hx gestational diabetes:  -Will check A1c on her next lab follow-up  Follow-up in 4 months  Signed electronically by: Stefano Redgie Butts, MD  [facility]  [facility] [address] [place] [place], [place] [address] Phone: [phone] FAX: [phone]   CC: [facility], [facility] [address] [place] [address] Phone: [phone] Fax: [phone]   Return to Endocrinology clinic as below: Future Appointments  Date Time Provider Department Center  10/10/2023  2:00 PM Lopaka Karge, Donell Redgie, MD [facility] None

## 2023-10-16 ENCOUNTER — Encounter: Payer: Self-pay | Admitting: Internal Medicine

## 2023-10-16 ENCOUNTER — Ambulatory Visit (INDEPENDENT_AMBULATORY_CARE_PROVIDER_SITE_OTHER): Admitting: Internal Medicine

## 2023-10-16 VITALS — BP 126/80 | HR 97 | Ht 65.0 in | Wt 326.0 lb

## 2023-10-16 DIAGNOSIS — E0590 Thyrotoxicosis, unspecified without thyrotoxic crisis or storm: Secondary | ICD-10-CM | POA: Diagnosis not present

## 2023-10-16 DIAGNOSIS — E0500 Thyrotoxicosis with diffuse goiter without thyrotoxic crisis or storm: Secondary | ICD-10-CM | POA: Diagnosis not present

## 2023-10-16 LAB — T4, FREE: Free T4: 1.4 ng/dL (ref 0.8–1.8)

## 2023-10-16 LAB — TSH: TSH: <0.01 m[IU]/L — ABNORMAL LOW

## 2023-10-16 MED ORDER — PROPYLTHIOURACIL 50 MG PO TABS
100.0000 mg | ORAL_TABLET | Freq: Two times a day (BID) | ORAL | 6 refills | Status: DC
Start: 2023-10-16 — End: 2023-10-30

## 2023-10-16 NOTE — Progress Notes (Signed)
 Name: Rachel Mendez  MRN/ DOB: [id number], February 25, 1986    Age/ Sex: 38 y.o., female    PCP: [facility], [facility]   Reason for Endocrinology Evaluation: Hyperthyroidism     Date of Initial Endocrinology Evaluation: 05/16/2022    HPI: Rachel Mendez is a 38 y.o. female with a past medical history of Hyperthyroidism. The patient presented for initial endocrinology clinic visit on 05/16/2022 for consultative assistance with her Hyperthyroidism.   She was diagnosed with hyperthyroidism secondary to Graves' Disease in 07/2021 during pregnancy with a suppressed TSH < 0.005 uIU/mL , elevated FT4 at 2.11 ng/dL and elevated total and free t3  She was also noted with elevated Anti-TPO Abs 453 IU/mL    She was started on Methimazole  in 07/2021 but switched to PTU shortly after   During that pregnancy she was also diagnosed with Gestational DM  She is S/P vaginal delivery 11/11/2021   She did not tolerate methimazole  due to nausea  Mother with Hashimoto's thyroiditis  The patient was lost to follow-up after her initial visit from March, 2024 until her return in August, 2025  SUBJECTIVE:    Today (10/16/23):  Rachel Mendez is here for follow-up on Graves' disease.   The patient has not been to our clinic in 17 months Patient has been noted with recent weight loss, on Wegovy She has been exercising and following a healthier lifestyle Continues with breasting feeding her daughter  Rachel Mendez recently diagnosed with autism  Denies local neck swelling  No palpitations  No constipation or diarrhea  Has tremors when she misses PTU  Has occasional eye symptoms    PTU 50 mg , 2 tabs BID -mostly takes 2 tabs daily    HISTORY:  Past Medical History:  Past Medical History:  Diagnosis Date   Anxiety    Asthma    Depression    Gestational diabetes    Graves disease 07/21/2021   Past Surgical History:  Past Surgical History:  Procedure Laterality Date   CESAREAN SECTION       Social History:  reports that she has never smoked. She has never used smokeless tobacco. She reports that she does not currently use alcohol. She reports that she does not currently use drugs. Family History: family history includes ADD / ADHD in her sister; Anxiety disorder in her mother; Depression in her mother; Diabetes in her paternal grandfather; Epilepsy in her maternal grandmother and sister; Hashimoto's thyroiditis in her mother.   HOME MEDICATIONS: Allergies as of 10/16/2023       Reactions   Bee Venom Anaphylaxis   Latex Hives        Medication List        Accurate as of October 16, 2023  9:36 AM. If you have any questions, ask your nurse or doctor.          acetaminophen  325 MG tablet Commonly known as: Tylenol  Take 2 tablets (650 mg total) by mouth every 6 (six) hours as needed.   cetirizine 10 MG tablet Commonly known as: ZYRTEC Take 10 mg by mouth daily.   cyclobenzaprine  5 MG tablet Commonly known as: FLEXERIL  Take 1-2 tablets (5-10 mg total) by mouth 3 (three) times daily as needed for muscle spasms.   fluconazole  200 MG tablet Commonly known as: DIFLUCAN  Take 1 tablet (200 mg total) by mouth daily. Take two tablets on first day, then one tablet daily to complete 14 day regimen   furosemide  20 MG tablet Commonly known as: LASIX   Take 1 tablet (20 mg total) by mouth daily for 5 days.   meloxicam  15 MG tablet Commonly known as: MOBIC  Take 1 tablet (15 mg total) by mouth daily.   PARoxetine 10 MG tablet Commonly known as: PAXIL Take 10 mg by mouth daily.   propranolol 10 MG tablet Commonly known as: INDERAL Take 10 mg by mouth 3 (three) times daily as needed.   propylthiouracil  50 MG tablet Commonly known as: PTU Take 2 tablets (100 mg total) by mouth 2 (two) times daily.   sertraline  100 MG tablet Commonly known as: ZOLOFT  Take 100 mg by mouth in the morning and at bedtime.   Wegovy 0.25 MG/0.5ML Soaj SQ injection Generic drug:  semaglutide-weight management Inject 0.25 mg into the skin once a week.          REVIEW OF SYSTEMS: A comprehensive ROS was conducted with the patient and is negative except as per HPI    OBJECTIVE:  VS: BP 126/80 (BP Location: Left Arm, Patient Position: Sitting, Cuff Size: Normal)   Pulse 97   Ht 5' 5 (1.651 m)   Wt (!) 326 lb (147.9 kg)   SpO2 99%   BMI 54.25 kg/m    Wt Readings from Last 3 Encounters:  10/16/23 (!) 326 lb (147.9 kg)  08/30/23 (!) 340 lb (154.2 kg)  11/21/21 270 lb (122.5 kg)     EXAM:  General: Pt appears well and is in NAD  Eyes: External eye exam normal without stare, lid lag or exophthalmos.  EOM intact.    Neck: General: Supple without adenopathy. Thyroid : Thyroid  size normal.  No goiter or nodules appreciated.   Lungs: Clear with good BS bilat   Heart: Auscultation: RRR.  Extremities:  BL LE: trace pretibial edema  Mental Status: Judgment, insight: Intact Orientation: Oriented to time, place, and person Mood and affect: No depression, anxiety, or agitation     DATA REVIEWED:   Latest Reference Range & Units 10/16/23 09:48  TSH mIU/L <0.01 (L)  T4,Free(Direct) 0.8 - 1.8 ng/dL 1.4    ASSESSMENT/PLAN/RECOMMENDATIONS:   Hyperthyroidism:  -Patient is clinically euthyroid -Patient could not tolerate methimazole  due to nausea - She has been rationing PTU dose to only 2 tablets a day as she has not been to our clinic in over a year and a half and did not have enough PTU - We briefly discussed alternative therapy to include RAI versus thyroidectomy, we discussed the need for lifelong ALT-4 replacement with these modalities, patient is breast-feeding and would like to postpone any definitive treatment at this time   Medications : Continue PTU 50 mg, 2 tabs twice daily   Follow-up in 4 months  Signed electronically by: Stefano Redgie Butts, MD  [facility]  [facility] [address] [place] [place], [place] [address] Phone: [phone] FAX: [phone]   CC: Center, [facility] [address] [place] [address] Phone: [phone] Fax: [phone]   Return to Endocrinology clinic as below: No future appointments.

## 2023-10-17 ENCOUNTER — Ambulatory Visit: Payer: Self-pay | Admitting: Internal Medicine

## 2023-10-30 ENCOUNTER — Encounter: Payer: Self-pay | Admitting: Internal Medicine

## 2023-10-30 DIAGNOSIS — E0500 Thyrotoxicosis with diffuse goiter without thyrotoxic crisis or storm: Secondary | ICD-10-CM

## 2023-10-30 MED ORDER — PROPYLTHIOURACIL 50 MG PO TABS: 100.0000 mg | ORAL_TABLET | Freq: Three times a day (TID) | ORAL | Status: AC

## 2023-11-28 ENCOUNTER — Ambulatory Visit: Admitting: Family Medicine

## 2023-12-16 ENCOUNTER — Other Ambulatory Visit: Payer: Self-pay | Admitting: Internal Medicine

## 2023-12-16 ENCOUNTER — Other Ambulatory Visit

## 2023-12-16 DIAGNOSIS — E0500 Thyrotoxicosis with diffuse goiter without thyrotoxic crisis or storm: Secondary | ICD-10-CM

## 2023-12-17 LAB — T4, FREE: Free T4: 1 ng/dL (ref 0.8–1.8)

## 2023-12-17 LAB — TSH: TSH: 0.21 m[IU]/L — ABNORMAL LOW

## 2023-12-18 ENCOUNTER — Ambulatory Visit: Payer: Self-pay | Admitting: Internal Medicine

## 2024-01-10 LAB — LAB REPORT - SCANNED: EGFR: 115

## 2024-01-12 ENCOUNTER — Ambulatory Visit: Admitting: Nurse Practitioner

## 2024-01-12 VITALS — BP 102/70 | HR 100 | Temp 97.3°F | Ht 65.0 in | Wt 327.0 lb

## 2024-01-12 DIAGNOSIS — R1011 Right upper quadrant pain: Secondary | ICD-10-CM

## 2024-01-12 DIAGNOSIS — H66002 Acute suppurative otitis media without spontaneous rupture of ear drum, left ear: Secondary | ICD-10-CM

## 2024-01-12 MED ORDER — CIPROFLOXACIN-DEXAMETHASONE 0.3-0.1 % OT SUSP: 4.0000 [drp] | Freq: Two times a day (BID) | OTIC | 0 refills | Status: AC

## 2024-01-12 NOTE — Progress Notes (Signed)
 BP 102/70   Pulse 100   Temp (!) 97.3 F (36.3 C)   Ht 5' 5 (1.651 m)   Wt (!) 327 lb (148.3 kg)   LMP 12/29/2023 (Approximate)   SpO2 94%   Breastfeeding Yes   BMI 54.42 kg/m    Subjective:    Patient ID: Rachel Mendez, female    DOB: 07-02-1985, 38 y.o.   MRN: [id number]  HPI: Rachel Mendez is a 38 y.o. female  Chief Complaint  Patient presents with   Establish Care   Discussed the use of AI scribe software for clinical note transcription with the patient, who gave verbal consent to proceed.  History of Present Illness Rachel Mendez is a 38 year old female with Graves' disease who presents with right-sided abdominal pain and vomiting to establish care   Right-sided abdominal pain - Onset since Saturday (3 days prior to visit) - Pain localized to right side of abdomen - Exacerbated by movement, including driving, eating - Severe enough to require urgent care evaluation and hospital referral - Received morphine for pain management at hospital - Prescribed oxycodone for pain relief - Pain recurs after eating, including after a small amount of cinnamon roll - Significant impact on daily activities, unable to take children to daycare, requires husband's assistance due to pain and fatigue -reviewed er visit notes. Ct scan, ultrasound and labs reassuring. They did put in a referral for surgery regarding possible gallbladder dysfunction.  Reached out to surgery and they have patient scheduled for eval tomorrow at 345 pm.    Nausea and vomiting - Persistent vomiting since the night before the visit - Unable to retain any food or liquids, including water and Propel - Vomitus described as brown and gray - Vomiting occurred after eating grilled chicken, lettuce, mozzarella cheese, and a small amount of dressing - Continued vomiting despite attempts to eat lightly - Resultant dehydration, with decreased ability to breastfeed due to low milk production  Dehydration - Unable to  keep down fluids, including water and Propel - Decreased milk production while breastfeeding, attributed to dehydration  Graves' disease and antithyroid medication - History of Graves' disease - Currently taking PTU, 50 mg or 100 mg three times daily, but currently taking two tablets twice daily  Neuropsychiatric conditions and medications - History of depression, anxiety, and ADD - Currently taking Wellbutrin 300 mg daily, Paxil 10 mg daily, and propranolol 10 mg three times daily         01/12/2024    1:58 PM 09/11/2021    8:20 AM 05/16/2021    8:56 AM  Depression screen PHQ 2/9  Decreased Interest 1 0 1  Down, Depressed, Hopeless 1 0 1  PHQ - 2 Score 2 0 2  Altered sleeping 2 0 3  Tired, decreased energy 3 0 3  Change in appetite 3 0 3  Feeling bad or failure about yourself  2 0 2  Trouble concentrating 2 0 2  Moving slowly or fidgety/restless 0 0 0  Suicidal thoughts 0 0 0  PHQ-9 Score 14 0  15   Difficult doing work/chores Somewhat difficult       Data saved with a previous flowsheet row definition    Relevant past medical, surgical, family and social history reviewed and updated as indicated. Interim medical history since our last visit reviewed. Allergies and medications reviewed and updated.  Review of Systems  Ten systems reviewed and is negative except as mentioned in HPI  Objective:      BP 102/70   Pulse 100   Temp (!) 97.3 F (36.3 C)   Ht 5' 5 (1.651 m)   Wt (!) 327 lb (148.3 kg)   LMP 12/29/2023 (Approximate)   SpO2 94%   Breastfeeding Yes   BMI 54.42 kg/m    Wt Readings from Last 3 Encounters:  01/12/24 (!) 327 lb (148.3 kg)  10/16/23 (!) 326 lb (147.9 kg)  08/30/23 (!) 340 lb (154.2 kg)    Physical Exam GENERAL: Alert, cooperative, well developed, no acute distress HEENT: Normocephalic, normal oropharynx, moist mucous membranes, left TM erythematous  CHEST: Clear to auscultation bilaterally, No wheezes, rhonchi, or  crackles CARDIOVASCULAR: Normal heart rate and rhythm, S1 and S2 normal without murmurs ABDOMEN: Soft,tender RUQ, non-distended, without organomegaly, Normal bowel sounds EXTREMITIES: No cyanosis or edema NEUROLOGICAL: Cranial nerves grossly intact, Moves all extremities without gross motor or sensory deficit  Results for orders placed or performed in visit on 12/16/23  T4, free   Collection Time: 12/16/23  8:49 AM  Result Value Ref Range   Free T4 1.0 0.8 - 1.8 ng/dL  TSH   Collection Time: 12/16/23  8:49 AM  Result Value Ref Range   TSH 0.21 (L) mIU/L          Assessment & Plan:   Problem List Items Addressed This Visit   None Visit Diagnoses       RUQ abdominal pain    -  Primary     Non-recurrent acute suppurative otitis media of left ear without spontaneous rupture of tympanic membrane       Relevant Medications   ciprofloxacin-dexamethasone (CIPRODEX) OTIC suspension        Assessment and Plan Assessment & Plan Gallbladder dysfunction with right upper quadrant pain and vomiting Gallbladder dysfunction with right upper quadrant pain and vomiting. Recent CT scan and ultrasound showed normal gallbladder and fatty liver. Pain and vomiting may be related to gallbladder dysfunction. Symptoms include severe pain exacerbated by eating, even small amounts, and persistent vomiting despite hydration. Lab work was reassuring with normal liver function and lipase levels. - Encouraged hydration - Scheduled appointment with surgeon for tomorrow at 3:45 PM  Left ear pain - Prescribed Cyprodex for ear pain  Depression, anxiety, and attention deficit disorder Depression, anxiety, and attention deficit disorder. Currently managed with Wellbutrin 300 mg daily, Paxil 10 mg daily, and propranolol 10 mg three times daily.        Follow up plan: Return in about 3 months (around 04/13/2024) for follow up.

## 2024-01-13 ENCOUNTER — Ambulatory Visit: Admitting: General Surgery

## 2024-01-13 ENCOUNTER — Encounter: Payer: Self-pay | Admitting: General Surgery

## 2024-01-13 VITALS — BP 138/88 | HR 101 | Temp 97.8°F | Ht 66.0 in | Wt 327.8 lb

## 2024-01-13 DIAGNOSIS — K8050 Calculus of bile duct without cholangitis or cholecystitis without obstruction: Secondary | ICD-10-CM

## 2024-01-13 NOTE — Patient Instructions (Signed)
 You have requested to have your gallbladder removed. This will be done at [facility] with Dr. Maurine Minister.  If you are on any injectable weight loss medication, you will need to stop taking your GLP-1 injectable (weight loss) medications 8 days before your surgery to avoid any complications with anesthesia.   You will most likely be out of work 1-2 weeks for this surgery.  If you have FMLA or disability paperwork that needs filled out you may drop this off at our office or this can be faxed to [phone].  You will return after your post-op appointment with a lifting restriction for approximately 4 more weeks.  You will be able to eat anything you would like to following surgery. But, start by eating a bland diet and advance this as tolerated. The Gallbladder diet is below, please go as closely by this diet as possible prior to surgery to avoid any further attacks.  Please see the (blue)pre-care form that you have been given today. Our surgery scheduler will call you to verify surgery date and to go over information.   If you have any questions, please call our office.  Laparoscopic Cholecystectomy Laparoscopic cholecystectomy is surgery to remove the gallbladder. The gallbladder is located in the upper right part of the abdomen, behind the liver. It is a storage sac for bile, which is produced in the liver. Bile aids in the digestion and absorption of fats. Cholecystectomy is often done for inflammation of the gallbladder (cholecystitis). This condition is usually caused by a buildup of gallstones (cholelithiasis) in the gallbladder. Gallstones can block the flow of bile, and that can result in inflammation and pain. In severe cases, emergency surgery may be required. If emergency surgery is not required, you will have time to prepare for the procedure. Laparoscopic surgery is an alternative to open surgery. Laparoscopic surgery has a shorter recovery time. Your common bile duct may also need  to be examined during the procedure. If stones are found in the common bile duct, they may be removed. LET [facility] CARE PROVIDER KNOW ABOUT: Any allergies you have. All medicines you are taking, including vitamins, herbs, eye drops, creams, and over-the-counter medicines. Previous problems you or members of your family have had with the use of anesthetics. Any blood disorders you have. Previous surgeries you have had.  Any medical conditions you have. RISKS AND COMPLICATIONS Generally, this is a safe procedure. However, problems may occur, including: Infection. Bleeding. Allergic reactions to medicines. Damage to other structures or organs. A stone remaining in the common bile duct. A bile leak from the cyst duct that is clipped when your gallbladder is removed. The need to convert to open surgery, which requires a larger incision in the abdomen. This may be necessary if your surgeon thinks that it is not safe to continue with a laparoscopic procedure. BEFORE THE PROCEDURE Ask your health care provider about: Changing or stopping your regular medicines. This is especially important if you are taking diabetes medicines or blood thinners. Taking medicines such as aspirin and ibuprofen. These medicines can thin your blood. Do not take these medicines before your procedure if your health care provider instructs you not to. Follow instructions from your health care provider about eating or drinking restrictions. Let your health care provider know if you develop a cold or an infection before surgery. Plan to have someone take you home after the procedure. Ask your health care provider how your surgical site will be marked or identified. You may  be given antibiotic medicine to help prevent infection. PROCEDURE To reduce your risk of infection: Your health care team will wash or sanitize their hands. Your skin will be washed with soap. An IV tube may be inserted into one of your  veins. You will be given a medicine to make you fall asleep (general anesthetic). A breathing tube will be placed in your mouth. The surgeon will make several small cuts (incisions) in your abdomen. A thin, lighted tube (laparoscope) that has a tiny camera on the end will be inserted through one of the small incisions. The camera on the laparoscope will send a picture to a TV screen (monitor) in the operating room. This will give the surgeon a good view inside your abdomen. A gas will be pumped into your abdomen. This will expand your abdomen to give the surgeon more room to perform the surgery. Other tools that are needed for the procedure will be inserted through the other incisions. The gallbladder will be removed through one of the incisions. After your gallbladder has been removed, the incisions will be closed with stitches (sutures), staples, or skin glue. Your incisions may be covered with a bandage (dressing). The procedure may vary among health care providers and hospitals. AFTER THE PROCEDURE Your blood pressure, heart rate, breathing rate, and blood oxygen level will be monitored often until the medicines you were given have worn off. You will be given medicines as needed to control your pain.   This information is not intended to replace advice given to you by your health care provider. Make sure you discuss any questions you have with your health care provider.   Document Released: 02/18/2005 Document Revised: 11/09/2014 Document Reviewed: 09/30/2012 Elsevier Interactive Patient Education 2016 Elsevier Inc.   Low-Fat Diet for Gallbladder Conditions A low-fat diet can be helpful if you have pancreatitis or a gallbladder condition. With these conditions, your pancreas and gallbladder have trouble digesting fats. A healthy eating plan with less fat will help rest your pancreas and gallbladder and reduce your symptoms. WHAT DO I NEED TO KNOW ABOUT THIS DIET? Eat a low-fat  diet. Reduce your fat intake to less than 20-30% of your total daily calories. This is less than 50-60 g of fat per day. Remember that you need some fat in your diet. Ask your dietician what your daily goal should be. Choose nonfat and low-fat healthy foods. Look for the words "nonfat," "low fat," or "fat free." As a guide, look on the label and choose foods with less than 3 g of fat per serving. Eat only one serving. Avoid alcohol. Do not smoke. If you need help quitting, talk with your health care provider. Eat small frequent meals instead of three large heavy meals. WHAT FOODS CAN I EAT? Grains Include healthy grains and starches such as potatoes, wheat bread, fiber-rich cereal, and brown rice. Choose whole grain options whenever possible. In adults, whole grains should account for 45-65% of your daily calories.  Fruits and Vegetables Eat plenty of fruits and vegetables. Fresh fruits and vegetables add fiber to your diet. Meats and Other Protein Sources Eat lean meat such as chicken and pork. Trim any fat off of meat before cooking it. Eggs, fish, and beans are other sources of protein. In adults, these foods should account for 10-35% of your daily calories. Dairy Choose low-fat milk and dairy options. Dairy includes fat and protein, as well as calcium.  Fats and Oils Limit high-fat foods such as fried foods, sweets, baked  goods, sugary drinks.  Other Creamy sauces and condiments, such as mayonnaise, can add extra fat. Think about whether or not you need to use them, or use smaller amounts or low fat options. WHAT FOODS ARE NOT RECOMMENDED? High fat foods, such as: [employer]. Ice cream. [place] toast. Sweet rolls. Pizza. Cheese bread. Foods covered with batter, butter, creamy sauces, or cheese. Fried foods. Sugary drinks and desserts. Foods that cause gas or bloating   This information is not intended to replace advice given to you by your health care provider. Make sure you  discuss any questions you have with your health care provider.   Document Released: 02/23/2013 Document Reviewed: 02/23/2013 Elsevier Interactive Patient Education [employer]

## 2024-01-14 ENCOUNTER — Ambulatory Visit: Payer: Self-pay | Admitting: General Surgery

## 2024-01-14 DIAGNOSIS — K8050 Calculus of bile duct without cholangitis or cholecystitis without obstruction: Secondary | ICD-10-CM

## 2024-01-14 NOTE — Progress Notes (Signed)
 Patient has been advised of Pre-Admission date/time, and Surgery date at [facility].  Surgery Date: 01/23/24 Preadmission Testing Date: 01/19/24 (phone 1P-4P)  Patient has been made aware to call [phone], between 1-3:00pm the day before surgery, to find out what time to arrive for surgery.

## 2024-01-15 ENCOUNTER — Encounter: Payer: Self-pay | Admitting: Emergency Medicine

## 2024-01-15 ENCOUNTER — Emergency Department
Admission: EM | Admit: 2024-01-15 | Discharge: 2024-01-15 | Disposition: A | Discharge status: Home / Self Care | Attending: Emergency Medicine | Admitting: Emergency Medicine

## 2024-01-15 ENCOUNTER — Other Ambulatory Visit: Payer: Self-pay

## 2024-01-15 DIAGNOSIS — R197 Diarrhea, unspecified: Secondary | ICD-10-CM | POA: Diagnosis present

## 2024-01-15 DIAGNOSIS — K529 Noninfective gastroenteritis and colitis, unspecified: Secondary | ICD-10-CM | POA: Diagnosis not present

## 2024-01-15 LAB — CBC
HCT: 42.2 % (ref 36.0–46.0)
Hemoglobin: 13.3 g/dL (ref 12.0–15.0)
MCH: 26.2 pg (ref 26.0–34.0)
MCHC: 31.5 g/dL (ref 30.0–36.0)
MCV: 83.2 fL (ref 80.0–100.0)
Platelets: 263 K/uL (ref 150–400)
RBC: 5.07 MIL/uL (ref 3.87–5.11)
RDW: 15.6 % — ABNORMAL HIGH (ref 11.5–15.5)
WBC: 9.5 K/uL (ref 4.0–10.5)
nRBC: 0 % (ref 0.0–0.2)

## 2024-01-15 LAB — URINALYSIS, ROUTINE W REFLEX MICROSCOPIC
Bilirubin Urine: NEGATIVE
Glucose, UA: NEGATIVE mg/dL
Hgb urine dipstick: NEGATIVE
Ketones, ur: NEGATIVE mg/dL
Leukocytes,Ua: NEGATIVE
Nitrite: NEGATIVE
Protein, ur: NEGATIVE mg/dL
Specific Gravity, Urine: 1.027 (ref 1.005–1.030)
pH: 5 (ref 5.0–8.0)

## 2024-01-15 LAB — COMPREHENSIVE METABOLIC PANEL WITH GFR
ALT: 14 U/L (ref 0–44)
AST: 17 U/L (ref 15–41)
Albumin: 3.9 g/dL (ref 3.5–5.0)
Alkaline Phosphatase: 118 U/L (ref 38–126)
Anion gap: 9 (ref 5–15)
BUN: 12 mg/dL (ref 6–20)
CO2: 24 mmol/L (ref 22–32)
Calcium: 8.9 mg/dL (ref 8.9–10.3)
Chloride: 105 mmol/L (ref 98–111)
Creatinine, Ser: 0.72 mg/dL (ref 0.44–1.00)
GFR, Estimated: >60 mL/min (ref >60)
Glucose, Bld: 103 mg/dL — ABNORMAL HIGH (ref 70–99)
Potassium: 4.3 mmol/L (ref 3.5–5.1)
Sodium: 138 mmol/L (ref 135–145)
Total Bilirubin: 0.5 mg/dL (ref 0.0–1.2)
Total Protein: 7.2 g/dL (ref 6.5–8.1)

## 2024-01-15 LAB — LIPASE, BLOOD: Lipase: 21 U/L (ref 11–51)

## 2024-01-15 LAB — POC URINE PREG, ED: Preg Test, Ur: NEGATIVE

## 2024-01-15 MED ORDER — ONDANSETRON 4 MG PO TBDP: 4.0000 mg | ORAL_TABLET | Freq: Three times a day (TID) | ORAL | 0 refills | Status: AC | PRN

## 2024-01-15 MED ORDER — ONDANSETRON 8 MG PO TBDP
8.0000 mg | ORAL_TABLET | Freq: Once | ORAL | Status: AC
  Administered 2024-01-15: 8 mg via ORAL
  Filled 2024-01-15: qty 1

## 2024-01-15 NOTE — ED Notes (Signed)
 See triage note   Presents with some n/v/d   States she has GB disease and scheduled to have it removed  But vomiting has gotten worse

## 2024-01-15 NOTE — ED Provider Notes (Signed)
 [facility] Provider Note    Event Date/Time   First MD Initiated Contact with Patient 01/15/24 [phone]     (approximate)   History   Emesis and Diarrhea   HPI  Rachel Mendez is a 38 y.o. female with a history of anxiety, asthma who presents with complaints of nausea vomiting diarrhea which started overnight.  She reports she is been seeing surgery for removal of gallbladder.  Currently she denies any abdominal pain.     Physical Exam   Triage Vital Signs: ED Triage Vitals  Encounter Vitals Group     BP 01/15/24 0923 138/70     Girls Systolic BP Percentile --      Girls Diastolic BP Percentile --      Boys Systolic BP Percentile --      Boys Diastolic BP Percentile --      Pulse Rate 01/15/24 0923 (!) 105     Resp 01/15/24 0923 18     Temp 01/15/24 0923 97.9 F (36.6 C)     Temp Source 01/15/24 0923 Oral     SpO2 01/15/24 0923 95 %     Weight 01/15/24 0920 (!) 147.4 kg (325 lb)     Height 01/15/24 0920 1.651 m (5' 5)     Head Circumference --      Peak Flow --      Pain Score 01/15/24 0920 5     Pain Loc --      Pain Education --      Exclude from Growth Chart --     Most recent vital signs: Vitals:   01/15/24 0923  BP: 138/70  Pulse: (!) 105  Resp: 18  Temp: 97.9 F (36.6 C)  SpO2: 95%     General: Awake, no distress.  Moist mucous membrane CV:  Good peripheral perfusion.  Resp:  Normal effort.  Abd:  No distention.  Soft, nontender, reassuring exam, no right upper quadrant tenderness Other:     ED Results / Procedures / Treatments   Labs (all labs ordered are listed, but only abnormal results are displayed) Labs Reviewed  COMPREHENSIVE METABOLIC PANEL WITH GFR - Abnormal; Notable for the following components:      Result Value   Glucose, Bld 103 (*)    All other components within normal limits  CBC - Abnormal; Notable for the following components:   RDW 15.6 (*)    All other components within normal limits   URINALYSIS, ROUTINE W REFLEX MICROSCOPIC - Abnormal; Notable for the following components:   Color, Urine YELLOW (*)    APPearance HAZY (*)    All other components within normal limits  LIPASE, BLOOD  POC URINE PREG, ED     EKG     RADIOLOGY     PROCEDURES:  Critical Care performed:   Procedures   MEDICATIONS ORDERED IN ED: Medications  ondansetron  (ZOFRAN -ODT) disintegrating tablet 8 mg (8 mg Oral Given 01/15/24 1029)     IMPRESSION / MDM / ASSESSMENT AND PLAN / ED COURSE  I reviewed the triage vital signs and the nursing notes. Patient's presentation is most consistent with acute illness / injury with system symptoms.  Patient presents with nausea vomiting diarrhea as detailed above, she has no abdominal pain, reassuring abdominal exam.  Lab work reviewed and is reassuring as well.  Differential includes foodborne illness, viral gastroenteritis  Presentation not consistent with gallbladder disease or biliary colic  Will treat with ODT Zofran , supportive care Zofran  prescription at  home, outpatient follow-up as needed, return precautions discussed, she agrees to this plan       FINAL CLINICAL IMPRESSION(S) / ED DIAGNOSES   Final diagnoses:  Gastroenteritis     Rx / DC Orders   ED Discharge Orders          Ordered    ondansetron  (ZOFRAN -ODT) 4 MG disintegrating tablet  Every 8 hours PRN        01/15/24 1056             Note:  This document was prepared using Dragon voice recognition software and may include unintentional dictation errors.   Arlander Charleston, MD 01/15/24 [phone]

## 2024-01-15 NOTE — ED Triage Notes (Signed)
 Pt via POV from home. Pt c/o NVD, states she has been nauseous for the past couple of weeks and suppose to have her gallbladder removed next week but states this AM she started having severe NVD, both vomit and diarrhea are yellow. Denies fever. Pt is A&Ox4 and NAD, ambulatory to triage

## 2024-01-19 ENCOUNTER — Inpatient Hospital Stay: Admission: RE | Admit: 2024-01-19 | Source: Ambulatory Visit

## 2024-01-20 NOTE — Progress Notes (Signed)
 Patient ID: Rachel Mendez, female   DOB: 03/25/1985, 38 y.o.   MRN: [id number] CC: Biliary Colic History of Present Illness Ivet Guerrieri is a 38 y.o. female with past medical history as below including obesity that presents in consultation for right upper quadrant pain.  The patient reports that she does have right upper quadrant pain that happens after eating.  She says that this is associated with nausea and vomiting.  She denies any jaundice or fevers.  She went to the emergency department in urgent care and has had a CT that showed cholelithiasis.  Past Medical History Past Medical History:  Diagnosis Date   Anxiety    Asthma    Depression    Gestational diabetes    Graves disease 07/21/2021       Past Surgical History:  Procedure Laterality Date   CESAREAN SECTION      Allergies  Allergen Reactions   Bee Venom Anaphylaxis   Latex Hives    Current Outpatient Medications  Medication Sig Dispense Refill   buPROPion (WELLBUTRIN XL) 300 MG 24 hr tablet Take 300 mg by mouth daily.     cetirizine (ZYRTEC) 10 MG tablet Take 10 mg by mouth daily.     PARoxetine (PAXIL) 10 MG tablet Take 10 mg by mouth daily.     propranolol (INDERAL) 10 MG tablet Take 10-20 mg by mouth See admin instructions. Take 20 mg by mouth in the morning and 10 mg in the evening     propylthiouracil  (PTU) 50 MG tablet Take 2 tablets (100 mg total) by mouth 3 (three) times daily.     albuterol  (VENTOLIN  HFA) 108 (90 Base) MCG/ACT inhaler Inhale 2 puffs into the lungs every 4 (four) hours as needed for shortness of breath or wheezing.     budesonide-formoterol (SYMBICORT) 160-4.5 MCG/ACT inhaler Inhale 2 puffs into the lungs 2 (two) times daily.     EPINEPHrine  0.3 mg/0.3 mL IJ SOAJ injection Inject 0.3 mg into the muscle as needed for anaphylaxis.     fluticasone (FLONASE) 50 MCG/ACT nasal spray Place 1 spray into both nostrils 2 (two) times daily as needed for allergies.     naproxen (NAPROSYN) 500 MG tablet  Take 500 mg by mouth 2 (two) times daily as needed for moderate pain (pain score 4-6).     ondansetron  (ZOFRAN -ODT) 4 MG disintegrating tablet Take 1 tablet (4 mg total) by mouth every 8 (eight) hours as needed for nausea or vomiting. 20 tablet 0   oxyCODONE  (OXY IR/ROXICODONE ) 5 MG immediate release tablet Take 5 mg by mouth every 6 (six) hours as needed for moderate pain (pain score 4-6).     promethazine  (PHENERGAN ) 25 MG tablet Take 25 mg by mouth every 8 (eight) hours as needed for vomiting or nausea.     SUMAtriptan (IMITREX) 50 MG tablet Take 50 mg by mouth every 2 (two) hours as needed for migraine.     No current facility-administered medications for this visit.    Family History Family History  Problem Relation Age of Onset   Anxiety disorder Mother    Depression Mother    Hashimoto's thyroiditis Mother    ADD / ADHD Sister    Epilepsy Sister    Epilepsy Maternal Grandmother    Diabetes Paternal Grandfather        Type 1       Social History Social History   Tobacco Use   Smoking status: Never   Smokeless tobacco: Never  Vaping Use  Vaping status: Former   Quit date: 04/04/2021   Substances: Nicotine  Substance Use Topics   Alcohol use: Not Currently   Drug use: Not Currently        ROS Full ROS of systems performed and is otherwise negative there than what is stated in the HPI  Physical Exam Blood pressure 138/88, pulse (!) 101, temperature 97.8 F (36.6 C), temperature source Oral, height 5' 6 (1.676 m), weight (!) 327 lb 12.8 oz (148.7 kg), last menstrual period 12/29/2023, SpO2 96%, currently breastfeeding. Alert and oriented x 3, normal work of breathing room air, obese, nontender and nondistended, moving all extremities spontaneously, mood and affect appropriate  Data Reviewed CT scan read reviewed and significant for cholelithiasis  I have personally reviewed the patient's imaging and medical records.    Assessment    Patient with biliary  colic  Plan    Plan for robotic assisted cholecystectomy.  I discussed the risk, benefits alternatives of the procedure including risk infection, bleeding, injury, bile duct, bile leak, retained stone, need for open procedure.  She understands his risk and wishes to proceed with surgery    Jayson MALVA Endow

## 2024-01-21 ENCOUNTER — Encounter
Admission: RE | Admit: 2024-01-21 | Discharge: 2024-01-21 | Disposition: A | Discharge status: Home / Self Care | Source: Ambulatory Visit | Attending: General Surgery | Admitting: General Surgery

## 2024-01-21 ENCOUNTER — Other Ambulatory Visit: Payer: Self-pay

## 2024-01-21 DIAGNOSIS — K8050 Calculus of bile duct without cholangitis or cholecystitis without obstruction: Secondary | ICD-10-CM

## 2024-01-21 DIAGNOSIS — Z01812 Encounter for preprocedural laboratory examination: Secondary | ICD-10-CM

## 2024-01-21 HISTORY — DX: Family history of other specified conditions: Z84.89

## 2024-01-21 HISTORY — DX: Personal history of urinary calculi: Z87.442

## 2024-01-21 HISTORY — DX: Anemia, unspecified: D64.9

## 2024-01-21 HISTORY — DX: Calculus of bile duct without cholangitis or cholecystitis without obstruction: K80.50

## 2024-01-21 HISTORY — DX: Thyrotoxicosis, unspecified without thyrotoxic crisis or storm: E05.90

## 2024-01-21 HISTORY — DX: Headache, unspecified: R51.9

## 2024-01-21 HISTORY — DX: Pneumonia, unspecified organism: J18.9

## 2024-01-21 HISTORY — DX: Dyspnea, unspecified: R06.00

## 2024-01-21 NOTE — Patient Instructions (Addendum)
 Your procedure is scheduled on: 01/23/24 - Friday Report to the Registration Desk on the 1st floor of the [facility]. To find out your arrival time, please call [phone] between 1PM - 3PM on: 01/22/24 Thursday If your arrival time is 6:00 am, do not arrive before that time as the [facility] entrance doors do not open until 6:00 am.  REMEMBER: Instructions that are not followed completely may result in serious medical risk, up to and including death; or upon the discretion of your surgeon and anesthesiologist your surgery may need to be rescheduled.  Do not eat food or drink any liquids after midnight the night before surgery.  No gum chewing or hard candies.  One week prior to surgery: Stop Anti-inflammatories (NSAIDS) such as Advil , Aleve, Ibuprofen , Motrin , Naproxen, Naprosyn and Aspirin based products such as Excedrin, Goody's Powder, BC Powder. You may take Tylenol  if needed for pain up until the day of surgery.  Stop ANY OVER THE COUNTER supplements until after surgery.  ON THE DAY OF SURGERY ONLY TAKE THESE MEDICATIONS WITH SIPS OF WATER:  SYMBICORT buPROPion (WELLBUTRIN ) PARoxetine (PAXIL)  propranolol (INDERAL)  propylthiouracil  (PTU)   Use inhalers albuterol on the day of surgery and bring to the hospital.  No Alcohol for 24 hours before or after surgery.  No Smoking including e-cigarettes for 24 hours before surgery.  No chewable tobacco products for at least 6 hours before surgery.  No nicotine patches on the day of surgery.  Do not use any recreational drugs for at least a week (preferably 2 weeks) before your surgery.  Please be advised that the combination of cocaine and anesthesia may have negative outcomes, up to and including death. If you test positive for cocaine, your surgery will be cancelled.  On the morning of surgery brush your teeth with toothpaste and water, you may rinse your mouth with mouthwash if you wish. Do not swallow any toothpaste  or mouthwash.  Use CHG Soap or wipes as directed on instruction sheet.  Do not wear jewelry, make-up, hairpins, clips or nail polish.  For welded (permanent) jewelry: bracelets, anklets, waist bands, etc.  Please have this removed prior to surgery.  If it is not removed, there is a chance that hospital personnel will need to cut it off on the day of surgery.  Do not wear lotions, powders, or perfumes.   Do not shave body hair from the neck down 48 hours before surgery.  Contact lenses, hearing aids and dentures may not be worn into surgery.  Do not bring valuables to the hospital. [facility] is not responsible for any missing/lost belongings or valuables.   Notify your doctor if there is any change in your medical condition (cold, fever, infection).  Wear comfortable clothing (specific to your surgery type) to the hospital.  After surgery, you can help prevent lung complications by doing breathing exercises.  Take deep breaths and cough every 1-2 hours. Your doctor may order a device called an Incentive Spirometer to help you take deep breaths.  When coughing or sneezing, hold a pillow firmly against your incision with both hands. This is called "splinting." Doing this helps protect your incision. It also decreases belly discomfort.  If you are being admitted to the hospital overnight, leave your suitcase in the car. After surgery it may be brought to your room.  In case of increased patient census, it may be necessary for you, the patient, to continue your postoperative care in the Same Day  Surgery department.  If you are being discharged the day of surgery, you will not be allowed to drive home. You will need a responsible individual to drive you home and stay with you for 24 hours after surgery.   If you are taking public transportation, you will need to have a responsible individual with you.  Please call the Pre-admissions Testing Dept. at [phone] if you have any  questions about these instructions.  Surgery Visitation Policy:  Patients having surgery or a procedure may have two visitors.  Children under the age of 30 must have an adult with them who is not the patient.  Inpatient Visitation:    Visiting hours are 7 a.m. to 8 p.m. Up to four visitors are allowed at one time in a patient room. The visitors may rotate out with other people during the day.  One visitor age 57 or older may stay with the patient overnight and must be in the room by 8 p.m.   Merchandiser, Retail to address health-related social needs:  [email or web address]                                                                                                            Preparing for Surgery with CHLORHEXIDINE  GLUCONATE (CHG) Soap  Chlorhexidine  Gluconate (CHG) Soap  o An antiseptic cleaner that kills germs and bonds with the skin to continue killing germs even after washing  o Used for showering the night before surgery and morning of surgery  Before surgery, you can play an important role by reducing the number of germs on your skin.  CHG (Chlorhexidine  gluconate) soap is an antiseptic cleanser which kills germs and bonds with the skin to continue killing germs even after washing.  Please do not use if you have an allergy to CHG or antibacterial soaps. If your skin becomes reddened/irritated stop using the CHG.  1. Shower the NIGHT BEFORE SURGERY with CHG soap.  2. If you choose to wash your hair, wash your hair first as usual with your normal shampoo.  3. After shampooing, rinse your hair and body thoroughly to remove the shampoo.  4. Use CHG as you would any other liquid soap. You can apply CHG directly to the skin and wash gently with a clean washcloth.  5. Apply the CHG soap to your body only from the neck down. Do not use on open wounds or open sores. Avoid contact with your eyes, ears, mouth, and genitals (private parts). Wash face and  genitals (private parts) with your normal soap.  6. Wash thoroughly, paying special attention to the area where your surgery will be performed.  7. Thoroughly rinse your body with warm water.  8. Do not shower/wash with your normal soap after using and rinsing off the CHG soap.  9. Do not use lotions, oils, etc., after showering with CHG.  10. Pat yourself dry with a clean towel.  11. Wear clean pajamas to bed the night before surgery.  12. Place clean sheets on your bed the night of your  shower and do not sleep with pets.  13. Do not apply any deodorants/lotions/powders.  14. Please wear clean clothes to the hospital.  15. Remember to brush your teeth with your regular toothpaste.

## 2024-01-22 MED ORDER — ORAL CARE MOUTH RINSE: 15.0000 mL | Freq: Once | OROMUCOSAL | Status: AC

## 2024-01-22 MED ORDER — SODIUM CHLORIDE 0.9 % IV SOLN
2.0000 g | INTRAVENOUS | Status: AC
  Administered 2024-01-23: 2 g via INTRAVENOUS

## 2024-01-22 MED ORDER — CHLORHEXIDINE GLUCONATE 0.12 % MT SOLN
15.0000 mL | Freq: Once | OROMUCOSAL | Status: AC
  Administered 2024-01-23: 15 mL via OROMUCOSAL

## 2024-01-22 MED ORDER — INDOCYANINE GREEN 25 MG IV SOLR
1.2500 mg | Freq: Once | INTRAVENOUS | Status: AC
  Administered 2024-01-23: 1.25 mg via INTRAVENOUS

## 2024-01-22 MED ORDER — LACTATED RINGERS IV SOLN: INTRAVENOUS | Status: DC

## 2024-01-22 MED ORDER — CHLORHEXIDINE GLUCONATE CLOTH 2 % EX PADS
6.0000 | MEDICATED_PAD | Freq: Once | CUTANEOUS | Status: AC
  Administered 2024-01-23: 6 via TOPICAL

## 2024-01-23 ENCOUNTER — Encounter
Admission: RE | Disposition: A | Discharge status: Home / Self Care | Payer: Self-pay | Source: Home / Self Care | Attending: General Surgery

## 2024-01-23 ENCOUNTER — Other Ambulatory Visit: Payer: Self-pay

## 2024-01-23 ENCOUNTER — Ambulatory Visit: Admitting: Anesthesiology

## 2024-01-23 ENCOUNTER — Encounter: Payer: Self-pay | Admitting: General Surgery

## 2024-01-23 ENCOUNTER — Ambulatory Visit
Admission: RE | Admit: 2024-01-23 | Discharge: 2024-01-23 | Disposition: A | Discharge status: Home / Self Care | Attending: General Surgery | Admitting: General Surgery

## 2024-01-23 DIAGNOSIS — K828 Other specified diseases of gallbladder: Secondary | ICD-10-CM | POA: Diagnosis not present

## 2024-01-23 DIAGNOSIS — Z01812 Encounter for preprocedural laboratory examination: Secondary | ICD-10-CM

## 2024-01-23 DIAGNOSIS — Z7951 Long term (current) use of inhaled steroids: Secondary | ICD-10-CM | POA: Diagnosis not present

## 2024-01-23 DIAGNOSIS — K811 Chronic cholecystitis: Secondary | ICD-10-CM | POA: Insufficient documentation

## 2024-01-23 DIAGNOSIS — E6689 Other obesity not elsewhere classified: Secondary | ICD-10-CM | POA: Diagnosis not present

## 2024-01-23 DIAGNOSIS — E0590 Thyrotoxicosis, unspecified without thyrotoxic crisis or storm: Secondary | ICD-10-CM | POA: Diagnosis not present

## 2024-01-23 DIAGNOSIS — K8050 Calculus of bile duct without cholangitis or cholecystitis without obstruction: Secondary | ICD-10-CM

## 2024-01-23 DIAGNOSIS — F32A Depression, unspecified: Secondary | ICD-10-CM | POA: Insufficient documentation

## 2024-01-23 DIAGNOSIS — Z6841 Body Mass Index (BMI) 40.0 and over, adult: Secondary | ICD-10-CM | POA: Insufficient documentation

## 2024-01-23 DIAGNOSIS — F419 Anxiety disorder, unspecified: Secondary | ICD-10-CM | POA: Diagnosis not present

## 2024-01-23 DIAGNOSIS — Z79899 Other long term (current) drug therapy: Secondary | ICD-10-CM | POA: Insufficient documentation

## 2024-01-23 HISTORY — PX: INDOCYANINE GREEN FLUORESCENCE IMAGING (ICG): SHX7595

## 2024-01-23 LAB — POCT PREGNANCY, URINE: Preg Test, Ur: NEGATIVE

## 2024-01-23 SURGERY — CHOLECYSTECTOMY, ROBOT-ASSISTED, LAPAROSCOPIC
Anesthesia: General

## 2024-01-23 MED ORDER — ACETAMINOPHEN 10 MG/ML IV SOLN
INTRAVENOUS | Status: DC | PRN
  Administered 2024-01-23: 1000 mg via INTRAVENOUS

## 2024-01-23 MED ORDER — KETOROLAC TROMETHAMINE 30 MG/ML IJ SOLN
INTRAMUSCULAR | Status: DC | PRN
  Administered 2024-01-23: 15 mg via INTRAVENOUS

## 2024-01-23 MED ORDER — FENTANYL CITRATE (PF) 100 MCG/2ML IJ SOLN
25.0000 ug | INTRAMUSCULAR | Status: DC | PRN
  Administered 2024-01-23 (×3): 25 ug via INTRAVENOUS

## 2024-01-23 MED ORDER — ONDANSETRON HCL 4 MG/2ML IJ SOLN
INTRAMUSCULAR | Status: DC | PRN
Start: 2024-01-23 — End: 2024-01-23
  Administered 2024-01-23: 4 mg via INTRAVENOUS

## 2024-01-23 MED ORDER — ROCURONIUM BROMIDE 10 MG/ML (PF) SYRINGE
PREFILLED_SYRINGE | INTRAVENOUS | Status: AC
  Filled 2024-01-23: qty 10

## 2024-01-23 MED ORDER — OXYCODONE HCL 5 MG PO TABS
5.0000 mg | ORAL_TABLET | Freq: Once | ORAL | Status: AC
  Administered 2024-01-23: 5 mg via ORAL

## 2024-01-23 MED ORDER — DEXMEDETOMIDINE HCL IN NACL 80 MCG/20ML IV SOLN
INTRAVENOUS | Status: AC
  Filled 2024-01-23: qty 20

## 2024-01-23 MED ORDER — 0.9 % SODIUM CHLORIDE (POUR BTL) OPTIME
TOPICAL | Status: DC | PRN
  Administered 2024-01-23: 200 mL

## 2024-01-23 MED ORDER — SUCCINYLCHOLINE CHLORIDE 200 MG/10ML IV SOSY
PREFILLED_SYRINGE | INTRAVENOUS | Status: AC
  Filled 2024-01-23: qty 10

## 2024-01-23 MED ORDER — PROPOFOL 1000 MG/100ML IV EMUL
INTRAVENOUS | Status: AC
  Filled 2024-01-23: qty 100

## 2024-01-23 MED ORDER — MIDAZOLAM HCL 2 MG/2ML IJ SOLN
INTRAMUSCULAR | Status: AC
  Filled 2024-01-23: qty 2

## 2024-01-23 MED ORDER — OXYCODONE HCL 5 MG PO TABS
ORAL_TABLET | ORAL | Status: AC
  Filled 2024-01-23: qty 1

## 2024-01-23 MED ORDER — LIDOCAINE HCL (PF) 2 % IJ SOLN
INTRAMUSCULAR | Status: AC
  Filled 2024-01-23: qty 5

## 2024-01-23 MED ORDER — KETOROLAC TROMETHAMINE 30 MG/ML IJ SOLN
INTRAMUSCULAR | Status: AC
  Filled 2024-01-23: qty 1

## 2024-01-23 MED ORDER — DEXAMETHASONE SOD PHOSPHATE PF 10 MG/ML IJ SOLN
INTRAMUSCULAR | Status: DC | PRN
Start: 2024-01-23 — End: 2024-01-23
  Administered 2024-01-23: 10 mg via INTRAVENOUS

## 2024-01-23 MED ORDER — DROPERIDOL 2.5 MG/ML IJ SOLN: 0.6250 mg | Freq: Once | INTRAMUSCULAR | Status: DC | PRN

## 2024-01-23 MED ORDER — GLYCOPYRROLATE 0.2 MG/ML IJ SOLN
INTRAMUSCULAR | Status: DC | PRN
  Administered 2024-01-23: .2 mg via INTRAVENOUS

## 2024-01-23 MED ORDER — ROCURONIUM BROMIDE 100 MG/10ML IV SOLN
INTRAVENOUS | Status: DC | PRN
Start: 2024-01-23 — End: 2024-01-23
  Administered 2024-01-23: 20 mg via INTRAVENOUS
  Administered 2024-01-23: 60 mg via INTRAVENOUS
  Administered 2024-01-23: 10 mg via INTRAVENOUS

## 2024-01-23 MED ORDER — LIDOCAINE HCL (CARDIAC) PF 100 MG/5ML IV SOSY
PREFILLED_SYRINGE | INTRAVENOUS | Status: DC | PRN
  Administered 2024-01-23: 100 mg via INTRAVENOUS

## 2024-01-23 MED ORDER — ACETAMINOPHEN 10 MG/ML IV SOLN
INTRAVENOUS | Status: AC
  Filled 2024-01-23: qty 100

## 2024-01-23 MED ORDER — SUCCINYLCHOLINE CHLORIDE 200 MG/10ML IV SOSY
PREFILLED_SYRINGE | INTRAVENOUS | Status: DC | PRN
  Administered 2024-01-23: 160 mg via INTRAVENOUS

## 2024-01-23 MED ORDER — PROPOFOL 10 MG/ML IV BOLUS
INTRAVENOUS | Status: AC
  Filled 2024-01-23: qty 60

## 2024-01-23 MED ORDER — OXYCODONE HCL 5 MG PO TABS: 5.0000 mg | ORAL_TABLET | Freq: Four times a day (QID) | ORAL | 0 refills | Status: AC | PRN

## 2024-01-23 MED ORDER — EPHEDRINE 5 MG/ML INJ
INTRAVENOUS | Status: AC
  Filled 2024-01-23: qty 5

## 2024-01-23 MED ORDER — BUPIVACAINE-EPINEPHRINE (PF) 0.5% -1:200000 IJ SOLN
INTRAMUSCULAR | Status: AC
  Filled 2024-01-23: qty 30

## 2024-01-23 MED ORDER — SODIUM CHLORIDE 0.9 % IV SOLN
INTRAVENOUS | Status: AC
  Filled 2024-01-23: qty 2

## 2024-01-23 MED ORDER — CHLORHEXIDINE GLUCONATE 0.12 % MT SOLN
OROMUCOSAL | Status: AC
  Filled 2024-01-23: qty 15

## 2024-01-23 MED ORDER — SUGAMMADEX SODIUM 200 MG/2ML IV SOLN
INTRAVENOUS | Status: DC | PRN
  Administered 2024-01-23: 589.6 mg via INTRAVENOUS

## 2024-01-23 MED ORDER — BUPIVACAINE-EPINEPHRINE (PF) 0.5% -1:200000 IJ SOLN
INTRAMUSCULAR | Status: DC | PRN
  Administered 2024-01-23: 30 mL

## 2024-01-23 MED ORDER — DEXMEDETOMIDINE HCL IN NACL 80 MCG/20ML IV SOLN
INTRAVENOUS | Status: DC | PRN
  Administered 2024-01-23: 4 ug via INTRAVENOUS
  Administered 2024-01-23 (×2): 8 ug via INTRAVENOUS

## 2024-01-23 MED ORDER — PROPOFOL 10 MG/ML IV BOLUS
INTRAVENOUS | Status: DC | PRN
  Administered 2024-01-23: 240 mg via INTRAVENOUS
  Administered 2024-01-23: 20 ug/kg/min via INTRAVENOUS

## 2024-01-23 MED ORDER — PROPOFOL 10 MG/ML IV BOLUS
INTRAVENOUS | Status: AC
Start: 2024-01-23 — End: 2024-01-23
  Filled 2024-01-23: qty 20

## 2024-01-23 MED ORDER — ALBUTEROL SULFATE HFA 108 (90 BASE) MCG/ACT IN AERS
INHALATION_SPRAY | RESPIRATORY_TRACT | Status: DC | PRN
  Administered 2024-01-23: 2 via RESPIRATORY_TRACT

## 2024-01-23 MED ORDER — FENTANYL CITRATE (PF) 100 MCG/2ML IJ SOLN
INTRAMUSCULAR | Status: AC
  Filled 2024-01-23: qty 2

## 2024-01-23 MED ORDER — ONDANSETRON HCL 4 MG/2ML IJ SOLN
INTRAMUSCULAR | Status: AC
  Filled 2024-01-23: qty 2

## 2024-01-23 MED ORDER — INDOCYANINE GREEN 25 MG IV SOLR
INTRAVENOUS | Status: AC
  Filled 2024-01-23: qty 10

## 2024-01-23 MED ORDER — FENTANYL CITRATE (PF) 100 MCG/2ML IJ SOLN
INTRAMUSCULAR | Status: DC | PRN
  Administered 2024-01-23: 25 ug via INTRAVENOUS
  Administered 2024-01-23: 50 ug via INTRAVENOUS
  Administered 2024-01-23: 25 ug via INTRAVENOUS

## 2024-01-23 MED ORDER — MIDAZOLAM HCL (PF) 2 MG/2ML IJ SOLN
INTRAMUSCULAR | Status: DC | PRN
  Administered 2024-01-23: 2 mg via INTRAVENOUS

## 2024-01-23 MED ORDER — EPHEDRINE SULFATE-NACL 50-0.9 MG/10ML-% IV SOSY
PREFILLED_SYRINGE | INTRAVENOUS | Status: DC | PRN
  Administered 2024-01-23 (×4): 5 mg via INTRAVENOUS

## 2024-01-23 MED ORDER — PROPOFOL 10 MG/ML IV BOLUS
INTRAVENOUS | Status: AC
  Filled 2024-01-23: qty 20

## 2024-01-23 SURGICAL SUPPLY — 38 items
BAG PRESSURE INF REUSE 1000 (BAG) IMPLANT
CANNULA REDUCER 12-8 DVNC XI (CANNULA) ×2 IMPLANT
CAUTERY HOOK MNPLR 1.6 DVNC XI (INSTRUMENTS) ×2 IMPLANT
CLIP LIGATING HEMO O LOK GREEN (MISCELLANEOUS) ×2 IMPLANT
DEFOGGER SCOPE WARM SEASHARP (MISCELLANEOUS) ×2 IMPLANT
DERMABOND ADVANCED .7 DNX12 (GAUZE/BANDAGES/DRESSINGS) ×2 IMPLANT
DRAPE ARM DVNC X/XI (DISPOSABLE) ×8 IMPLANT
DRAPE COLUMN DVNC XI (DISPOSABLE) ×2 IMPLANT
ELECTRODE REM PT RTRN 9FT ADLT (ELECTROSURGICAL) ×2 IMPLANT
FORCEPS BPLR 8 MD DVNC XI (FORCEP) IMPLANT
FORCEPS BPLR R/ABLATION 8 DVNC (INSTRUMENTS) ×2 IMPLANT
FORCEPS PROGRASP DVNC XI (FORCEP) ×2 IMPLANT
GLOVE BIOGEL PI IND STRL 7.5 (GLOVE) ×4 IMPLANT
GLOVE SURG SYN 7.0 PF PI (GLOVE) ×4 IMPLANT
GOWN STRL REUS W/ TWL LRG LVL3 (GOWN DISPOSABLE) ×6 IMPLANT
GRASPER SUT TROCAR 14GX15 (MISCELLANEOUS) ×2 IMPLANT
IRRIGATION STRYKERFLOW (MISCELLANEOUS) IMPLANT
IRRIGATOR SUCT 8 DISP DVNC XI (IRRIGATION / IRRIGATOR) IMPLANT
IV 0.9% NACL 1000 ML (IV SOLUTION) IMPLANT
KIT PINK PAD W/HEAD ARM REST (MISCELLANEOUS) ×2 IMPLANT
LABEL OR SOLS (LABEL) ×2 IMPLANT
MANIFOLD NEPTUNE II (INSTRUMENTS) ×2 IMPLANT
NDL HYPO 22X1.5 SAFETY MO (MISCELLANEOUS) ×2 IMPLANT
NDL INSUFFLATION 14GA 120MM (NEEDLE) ×2 IMPLANT
NEEDLE HYPO 22X1.5 SAFETY MO (MISCELLANEOUS) ×2 IMPLANT
NEEDLE INSUFFLATION 14GA 120MM (NEEDLE) ×2 IMPLANT
NS IRRIG 500ML POUR BTL (IV SOLUTION) ×2 IMPLANT
OBTURATOR OPTICALSTD 8 DVNC (TROCAR) ×2 IMPLANT
PACK LAP CHOLECYSTECTOMY (MISCELLANEOUS) ×2 IMPLANT
SEAL UNIV 5-12 XI (MISCELLANEOUS) ×8 IMPLANT
SET TUBE SMOKE EVAC HIGH FLOW (TUBING) ×2 IMPLANT
SOLN STERILE WATER 500 ML (IV SOLUTION) ×2 IMPLANT
SOLUTION ELECTROSURG ANTI STCK (MISCELLANEOUS) ×2 IMPLANT
SPIKE FLUID TRANSFER (MISCELLANEOUS) ×4 IMPLANT
SPONGE T-LAP 4X18 ~~LOC~~+RFID (SPONGE) IMPLANT
SUT VICRYL 0 UR6 27IN ABS (SUTURE) ×2 IMPLANT
SUTURE MNCRL 4-0 27XMF (SUTURE) ×2 IMPLANT
SYSTEM BAG RETRIEVAL 10MM (BASKET) ×2 IMPLANT

## 2024-01-23 NOTE — Transfer of Care (Signed)
 Immediate Anesthesia Transfer of Care Note  Patient: Rachel Mendez  Procedure(s) Performed: CHOLECYSTECTOMY, ROBOT-ASSISTED, LAPAROSCOPIC INDOCYANINE GREEN  FLUORESCENCE IMAGING (ICG)  Patient Location: PACU  Anesthesia Type:General  Level of Consciousness: drowsy  Airway & Oxygen Therapy: Patient Spontanous Breathing and Patient connected to face mask oxygen  Post-op Assessment: Report given to RN and Post -op Vital signs reviewed and stable  Post vital signs: Reviewed and stable  Last Vitals:  Vitals Value Taken Time  BP 160/99 01/23/24 09:03  Temp    Pulse 104 01/23/24 09:07  Resp 25 01/23/24 09:07  SpO2 96 % 01/23/24 09:07  Vitals shown include unfiled device data.  Last Pain:  Vitals:   01/23/24 0630  TempSrc: Temporal  PainSc: 0-No pain         Complications: No notable events documented.

## 2024-01-23 NOTE — Anesthesia Procedure Notes (Signed)
 Procedure Name: Intubation Date/Time: 01/23/2024 7:42 AM  Performed by: Dyane Mass, CRNAPre-anesthesia Checklist: Patient identified, Emergency Drugs available, Suction available and Patient being monitored Patient Re-evaluated:Patient Re-evaluated prior to induction Oxygen Delivery Method: Circle system utilized Preoxygenation: Pre-oxygenation with 100% oxygen Induction Type: IV induction and Rapid sequence Laryngoscope Size: McGrath and 3 Grade View: Grade III Tube type: Oral Tube size: 7.0 mm Number of attempts: 1 Airway Equipment and Method: Stylet and Oral airway Placement Confirmation: ETT inserted through vocal cords under direct vision, positive ETCO2 and breath sounds checked- equal and bilateral Secured at: 22 cm Tube secured with: Tape Dental Injury: Teeth and Oropharynx as per pre-operative assessment

## 2024-01-23 NOTE — Anesthesia Preprocedure Evaluation (Signed)
 Anesthesia Evaluation  Patient identified by MRN, date of birth, ID band Patient awake    Reviewed: Allergy & Precautions, H&P , NPO status , Patient's Chart, lab work & pertinent test results, reviewed documented beta blocker date and time   History of Anesthesia Complications Negative for: history of anesthetic complications  Airway Mallampati: I  TM Distance: >3 FB Neck ROM: full    Dental  (+) Dental Advidsory Given, Teeth Intact, Missing   Pulmonary neg shortness of breath, asthma , neg sleep apnea, neg COPD, neg recent URI   Pulmonary exam normal breath sounds clear to auscultation       Cardiovascular Exercise Tolerance: Good negative cardio ROS Normal cardiovascular exam Rhythm:regular Rate:Normal     Neuro/Psych  PSYCHIATRIC DISORDERS Anxiety Depression    negative neurological ROS     GI/Hepatic negative GI ROS, Neg liver ROS,,,  Endo/Other  neg diabetes Hyperthyroidism Class 4 obesity  Renal/GU negative Renal ROS  negative genitourinary   Musculoskeletal   Abdominal   Peds  Hematology negative hematology ROS (+)   Anesthesia Other Findings Past Medical History: No date: Anemia No date: Anxiety No date: Asthma No date: Biliary colic No date: Depression No date: Dyspnea No date: Family history of adverse reaction to anesthesia     Comment:  sister hard sedate No date: Gestational diabetes 07/21/2021: Graves disease No date: Headache No date: History of kidney stones No date: Hyperthyroidism No date: Pneumonia   Reproductive/Obstetrics negative OB ROS                              Anesthesia Physical Anesthesia Plan  ASA: 3  Anesthesia Plan: General   Post-op Pain Management:    Induction: Intravenous  PONV Risk Score and Plan: 3 and Ondansetron , Dexamethasone , Midazolam  and Treatment may vary due to age or medical condition  Airway Management Planned: Oral  ETT  Additional Equipment:   Intra-op Plan:   Post-operative Plan: Extubation in OR  Informed Consent: I have reviewed the patients History and Physical, chart, labs and discussed the procedure including the risks, benefits and alternatives for the proposed anesthesia with the patient or authorized representative who has indicated his/her understanding and acceptance.     Dental Advisory Given  Plan Discussed with: Anesthesiologist, CRNA and Surgeon  Anesthesia Plan Comments:         Anesthesia Quick Evaluation

## 2024-01-23 NOTE — Op Note (Signed)
 Robotic assisted laparoscopic Cholecystectomy  Pre-operative Diagnosis: Biliary Colic  Post-operative Diagnosis: Same  Procedure:  Robotic assisted laparoscopic Cholecystectomy  Surgeon: Jayson Endow, MD  Anesthesia: Gen. with endotracheal tube  Findings: Omental adhesions to gallbladder, critical view of safety obtained  Estimated Blood Loss:15 cc       Specimens: Gallbladder           Complications: none   Procedure Details  The patient was seen again in the Holding Room. The benefits, complications, treatment options, and expected outcomes were discussed with the patient. The risks of bleeding, infection, recurrence of symptoms, failure to resolve symptoms, bile duct damage, bile duct leak, retained common bile duct stone, bowel injury, any of which could require further surgery and/or ERCP, stent, or papillotomy were reviewed with the patient. The likelihood of improving the patient's symptoms with return to their baseline status is good.  The patient and/or family concurred with the proposed plan, giving informed consent.  The patient was taken to Operating Room, identified  and the procedure verified as robotic Cholecystectomy.  A Time Out was held and the above information confirmed.  Prior to the induction of general anesthesia, antibiotic prophylaxis was administered. VTE prophylaxis was in place. General endotracheal anesthesia was then administered and tolerated well. After the induction, the abdomen was prepped with Chloraprep and draped in the sterile fashion. The patient was positioned in the supine position.  A veress needle was inserted into the abdomen using standard drop technique. An 8mm infra-umbilical robotic port was then placed under direct visualization. There was no injury noted at the site of veress needle insertion. Two right sided abdominal 8mm ports followed by an 8mm left abdominal robotic ports were placed under direct visualization. The left sided  abdominal port was then upsized to a 12mm robotic port.  The patient was positioned  in reverse Trendelenburg, robot was brought to the surgical field and docked in the standard fashion.  We made sure all the instrumentation was kept indirect view at all times and that there were no collision between the arms. I scrubbed out and went to the console.  The gallbladder was identified, the fundus grasped and retracted cephalad. Adhesions were lysed bluntly. The infundibulum was grasped and retracted laterally, exposing the peritoneum overlying the triangle of Calot. This was then divided and exposed in a blunt fashion. An extended critical view of the cystic duct and cystic artery was obtained.  The cystic duct was clearly identified and bluntly dissected.   Artery and duct were double clipped and divided. Using ICG cholangiography we visualized the cystic duct. The gallbladder was taken from the gallbladder fossa in a retrograde fashion with the electrocautery.  Hemostasis was achieved with the electrocautery. nspection of the right upper quadrant was performed. No bleeding, bile duct injury or leak, or bowel injury was noted. Robotic instruments and robotic arms were undocked in the standard fashion.  I scrubbed back in.  The gallbladder was removed and placed in an Endocatch bag.   The left lower quadrant fascia was then closed with a 0 vicryl using a suture needle passer. The pre-peritoneal space was then infiltrated with liposomal bupivicaine and marcaine  solution. Pneumoperitoneum was released.  4-0 subcuticular Monocryl was used to close the skin. Dermabond was  applied.  The patient was then extubated and brought to the recovery room in stable condition. Sponge, lap, and needle counts were correct at closure and at the conclusion of the case.  Jayson Endow, M.D. [facility]

## 2024-01-23 NOTE — H&P (Signed)
 Seen in ED last week with gastroenteritis, reports that she has been able to eat now but small amounts nad mostly liquid. No other changes to below, proceed as planned  Expand All Collapse All  Patient ID: Rachel Mendez, female   DOB: Sep 17, 1985, 38 y.o.   MRN: [id number] CC: Biliary Colic History of Present Illness Rachel Mendez is a 38 y.o. female with past medical history as below including obesity that presents in consultation for right upper quadrant pain.  The patient reports that she does have right upper quadrant pain that happens after eating.  She says that this is associated with nausea and vomiting.  She denies any jaundice or fevers.  She went to the emergency department in urgent care and has had a CT that showed cholelithiasis.   Past Medical History     Past Medical History:  Diagnosis Date   Anxiety     Asthma     Depression     Gestational diabetes     Graves disease 07/21/2021                 Past Surgical History:  Procedure Laterality Date   CESAREAN SECTION              Allergies      Allergies  Allergen Reactions   Bee Venom Anaphylaxis   Latex Hives              Current Outpatient Medications  Medication Sig Dispense Refill   buPROPion (WELLBUTRIN XL) 300 MG 24 hr tablet Take 300 mg by mouth daily.       cetirizine (ZYRTEC) 10 MG tablet Take 10 mg by mouth daily.       PARoxetine (PAXIL) 10 MG tablet Take 10 mg by mouth daily.       propranolol (INDERAL) 10 MG tablet Take 10-20 mg by mouth See admin instructions. Take 20 mg by mouth in the morning and 10 mg in the evening       propylthiouracil  (PTU) 50 MG tablet Take 2 tablets (100 mg total) by mouth 3 (three) times daily.       albuterol  (VENTOLIN  HFA) 108 (90 Base) MCG/ACT inhaler Inhale 2 puffs into the lungs every 4 (four) hours as needed for shortness of breath or wheezing.       budesonide-formoterol (SYMBICORT) 160-4.5 MCG/ACT inhaler Inhale 2 puffs into the lungs 2 (two) times daily.        EPINEPHrine  0.3 mg/0.3 mL IJ SOAJ injection Inject 0.3 mg into the muscle as needed for anaphylaxis.       fluticasone (FLONASE) 50 MCG/ACT nasal spray Place 1 spray into both nostrils 2 (two) times daily as needed for allergies.       naproxen (NAPROSYN) 500 MG tablet Take 500 mg by mouth 2 (two) times daily as needed for moderate pain (pain score 4-6).       ondansetron  (ZOFRAN -ODT) 4 MG disintegrating tablet Take 1 tablet (4 mg total) by mouth every 8 (eight) hours as needed for nausea or vomiting. 20 tablet 0   oxyCODONE  (OXY IR/ROXICODONE ) 5 MG immediate release tablet Take 5 mg by mouth every 6 (six) hours as needed for moderate pain (pain score 4-6).       promethazine  (PHENERGAN ) 25 MG tablet Take 25 mg by mouth every 8 (eight) hours as needed for vomiting or nausea.       SUMAtriptan (IMITREX) 50 MG tablet Take 50 mg by mouth every 2 (two) hours as needed  for migraine.          No current facility-administered medications for this visit.        Family History      Family History  Problem Relation Age of Onset   Anxiety disorder Mother     Depression Mother     Hashimoto's thyroiditis Mother     ADD / ADHD Sister     Epilepsy Sister     Epilepsy Maternal Grandmother     Diabetes Paternal Grandfather          Type 1            Social History Social History  Social History        Tobacco Use   Smoking status: Never   Smokeless tobacco: Never  Vaping Use   Vaping status: Former   Quit date: 04/04/2021   Substances: Nicotine  Substance Use Topics   Alcohol use: Not Currently   Drug use: Not Currently            ROS Full ROS of systems performed and is otherwise negative there than what is stated in the HPI   Physical Exam Blood pressure 138/88, pulse (!) 101, temperature 97.8 F (36.6 C), temperature source Oral, height 5' 6 (1.676 m), weight (!) 327 lb 12.8 oz (148.7 kg), last menstrual period 12/29/2023, SpO2 96%, currently breastfeeding. Alert and  oriented x 3, normal work of breathing room air, obese, nontender and nondistended, moving all extremities spontaneously, mood and affect appropriate   Data Reviewed CT scan read reviewed and significant for cholelithiasis   I have personally reviewed the patient's imaging and medical records.     Assessment Assessment Patient with biliary colic   Plan Plan Plan for robotic assisted cholecystectomy.  I discussed the risk, benefits alternatives of the procedure including risk infection, bleeding, injury, bile duct, bile leak, retained stone, need for open procedure.  She understands his risk and wishes to proceed with surgery       Jayson MALVA Endow

## 2024-01-23 NOTE — Anesthesia Postprocedure Evaluation (Signed)
 Anesthesia Post Note  Patient: Rachel Mendez  Procedure(s) Performed: CHOLECYSTECTOMY, ROBOT-ASSISTED, LAPAROSCOPIC INDOCYANINE GREEN  FLUORESCENCE IMAGING (ICG)  Patient location during evaluation: PACU Anesthesia Type: General Level of consciousness: awake and alert Pain management: pain level controlled Vital Signs Assessment: post-procedure vital signs reviewed and stable Respiratory status: spontaneous breathing, nonlabored ventilation, respiratory function stable and patient connected to nasal cannula oxygen Cardiovascular status: blood pressure returned to baseline and stable Postop Assessment: no apparent nausea or vomiting Anesthetic complications: no   No notable events documented.   Last Vitals:  Vitals:   01/23/24 1045 01/23/24 1110  BP: 123/70 121/69  Pulse: 82 92  Resp: (!) 9 16  Temp:  (!) 36.2 C  SpO2: 95% 94%    Last Pain:  Vitals:   01/23/24 1110  TempSrc: Temporal  PainSc: 5                  Prentice Murphy

## 2024-01-24 ENCOUNTER — Encounter: Payer: Self-pay | Admitting: General Surgery

## 2024-01-26 LAB — SURGICAL PATHOLOGY

## 2024-01-27 ENCOUNTER — Encounter: Payer: Self-pay | Admitting: Nurse Practitioner

## 2024-02-05 ENCOUNTER — Encounter: Admitting: Physician Assistant

## 2024-02-12 ENCOUNTER — Ambulatory Visit: Admitting: Internal Medicine

## 2024-03-18 ENCOUNTER — Ambulatory Visit: Admitting: Internal Medicine

## 2024-03-18 NOTE — Progress Notes (Unsigned)
 "   Name: Rachel Mendez  MRN/ DOB: [id number], 1985/06/23    Age/ Sex: 39 y.o., female    PCP: Gareth Mliss FALCON, FNP   Reason for Endocrinology Evaluation: Hyperthyroidism     Date of Initial Endocrinology Evaluation: 05/16/2022    HPI: Rachel Mendez is a 39 y.o. female with a past medical history of Hyperthyroidism. The patient presented for initial endocrinology clinic visit on 05/16/2022 for consultative assistance with her Hyperthyroidism.   She was diagnosed with hyperthyroidism secondary to Graves' Disease in 07/2021 during pregnancy with a suppressed TSH < 0.005 uIU/mL , elevated FT4 at 2.11 ng/dL and elevated total and free t3  She was also noted with elevated Anti-TPO Abs 453 IU/mL    She was started on Methimazole  in 07/2021 but switched to PTU shortly after due to nausea with methimazole   During that pregnancy she was also diagnosed with Gestational DM  She is S/P vaginal delivery 11/11/2021    Mother with Hashimoto's thyroiditis  The patient was lost to follow-up after her initial visit from March, 2024 until her return in August, 2025  SUBJECTIVE:    Today (03/18/24):  Rachel Mendez is here for follow-up on Graves' disease.   Since her last visit here the patient underwent cholecystectomy in November, 2025    Patient has been noted with recent weight loss, on Wegovy She has been exercising and following a healthier lifestyle Continues with breasting feeding her daughter  Gilmer recently diagnosed with autism  Denies local neck swelling  No palpitations  No constipation or diarrhea  Has tremors when she misses PTU  Has occasional eye symptoms    PTU 50 mg , 2 tabs BID    HISTORY:  Past Medical History:  Past Medical History:  Diagnosis Date   Anemia    Anxiety    Asthma    Biliary colic    Depression    Dyspnea    Family history of adverse reaction to anesthesia    sister hard sedate   Gestational diabetes    Graves disease 07/21/2021    Headache    History of kidney stones    Hyperthyroidism    Pneumonia    Past Surgical History:  Past Surgical History:  Procedure Laterality Date   CESAREAN SECTION     INDOCYANINE GREEN  FLUORESCENCE IMAGING (ICG)  01/23/2024   Procedure: INDOCYANINE GREEN  FLUORESCENCE IMAGING (ICG);  Surgeon: Marinda Jayson KIDD, MD;  Location: [facility] ORS;  Service: General;;   TONSILLECTOMY     adnoidectomy at age 65    Social History:  reports that she has never smoked. She has never used smokeless tobacco. She reports that she does not currently use alcohol. She reports that she does not currently use drugs. Family History: family history includes ADD / ADHD in her sister; Anxiety disorder in her mother; Depression in her mother; Diabetes in her paternal grandfather; Epilepsy in her maternal grandmother and sister; Hashimoto's thyroiditis in her mother.   HOME MEDICATIONS: Allergies as of 03/18/2024       Reactions   Bee Venom Anaphylaxis   Latex Hives        Medication List        Accurate as of March 18, 2024  7:13 AM. If you have any questions, ask your nurse or doctor.          acetaminophen  500 MG tablet Commonly known as: TYLENOL  Take 1,000 mg by mouth every 6 (six) hours as needed for moderate pain (pain score  4-6).   albuterol  108 (90 Base) MCG/ACT inhaler Commonly known as: VENTOLIN  HFA Inhale 2 puffs into the lungs every 4 (four) hours as needed for shortness of breath or wheezing.   budesonide-formoterol 160-4.5 MCG/ACT inhaler Commonly known as: SYMBICORT Inhale 2 puffs into the lungs 2 (two) times daily.   buPROPion 300 MG 24 hr tablet Commonly known as: WELLBUTRIN XL Take 300 mg by mouth daily.   cetirizine 10 MG tablet Commonly known as: ZYRTEC Take 10 mg by mouth daily.   EPINEPHrine  0.3 mg/0.3 mL Soaj injection Commonly known as: EPI-PEN Inject 0.3 mg into the muscle as needed for anaphylaxis.   fluticasone 50 MCG/ACT nasal spray Commonly known as:  FLONASE Place 1 spray into both nostrils 2 (two) times daily as needed for allergies.   naproxen 500 MG tablet Commonly known as: NAPROSYN Take 500 mg by mouth 2 (two) times daily as needed for moderate pain (pain score 4-6).   ondansetron  4 MG disintegrating tablet Commonly known as: ZOFRAN -ODT Take 1 tablet (4 mg total) by mouth every 8 (eight) hours as needed for nausea or vomiting.   oxyCODONE  5 MG immediate release tablet Commonly known as: Oxy IR/ROXICODONE  Take 1 tablet (5 mg total) by mouth every 6 (six) hours as needed for severe pain (pain score 7-10).   PARoxetine 10 MG tablet Commonly known as: PAXIL Take 10 mg by mouth daily.   promethazine  25 MG tablet Commonly known as: PHENERGAN  Take 25 mg by mouth every 8 (eight) hours as needed for vomiting or nausea.   propranolol 10 MG tablet Commonly known as: INDERAL Take 10-20 mg by mouth See admin instructions. Take 20 mg by mouth in the morning and 10 mg in the evening   propylthiouracil  50 MG tablet Commonly known as: PTU Take 2 tablets (100 mg total) by mouth 3 (three) times daily.   SUMAtriptan 50 MG tablet Commonly known as: IMITREX Take 50 mg by mouth every 2 (two) hours as needed for migraine.          REVIEW OF SYSTEMS: A comprehensive ROS was conducted with the patient and is negative except as per HPI    OBJECTIVE:  VS: There were no vitals taken for this visit.   Wt Readings from Last 3 Encounters:  01/23/24 (!) 325 lb (147.4 kg)  01/15/24 (!) 325 lb (147.4 kg)  01/13/24 (!) 327 lb 12.8 oz (148.7 kg)     EXAM:  General: Pt appears well and is in NAD  Eyes: External eye exam normal without stare, lid lag or exophthalmos.  EOM intact.    Neck: General: Supple without adenopathy. Thyroid : Thyroid  size normal.  No goiter or nodules appreciated.   Lungs: Clear with good BS bilat   Heart: Auscultation: RRR.  Extremities:  BL LE: trace pretibial edema  Mental Status: Judgment, insight:  Intact Orientation: Oriented to time, place, and person Mood and affect: No depression, anxiety, or agitation     DATA REVIEWED:   Latest Reference Range & Units 10/16/23 09:48  TSH mIU/L <0.01 (L)  T4,Free(Direct) 0.8 - 1.8 ng/dL 1.4    ASSESSMENT/PLAN/RECOMMENDATIONS:   Hyperthyroidism:  -Patient is clinically euthyroid -Patient could not tolerate methimazole  due to nausea - We briefly discussed alternative therapy to include RAI versus thyroidectomy, we discussed the need for lifelong ALT-4 replacement with these modalities, patient is breast-feeding and would like to postpone any definitive treatment at this time   Medications : Continue PTU 50 mg, 2 tabs twice daily  Follow-up in 4 months  Signed electronically by: Stefano Redgie Butts, MD  [facility] Endocrinology  [facility] [address] [place] [place], [place] [address] Phone: [phone] FAX: [phone]   CC: Gareth Mliss FALCON, FNP [address] [place] [address] Phone: [phone] Fax: [phone]   Return to Endocrinology clinic as below: Future Appointments  Date Time Provider Department Center  03/18/2024  9:10 AM Cap Massi, Donell Redgie, MD [facility] None  04/13/2024  7:40 AM Gareth Mliss FALCON, FNP [facility]          "

## 2024-04-13 ENCOUNTER — Ambulatory Visit: Admitting: Nurse Practitioner
